# Patient Record
Sex: Male | Born: 1960 | Race: Black or African American | Hispanic: No | Marital: Married | Smoking: Never smoker
Health system: Southern US, Community
[De-identification: ages and names within clinical notes are randomized; demographics above are authoritative.]

## PROBLEM LIST (undated history)

## (undated) DIAGNOSIS — I10 Essential (primary) hypertension: Secondary | ICD-10-CM

## (undated) HISTORY — PX: NO PAST SURGERIES: SHX2092

---

## 2015-07-20 ENCOUNTER — Encounter: Payer: Self-pay | Admitting: Emergency Medicine

## 2015-07-20 ENCOUNTER — Ambulatory Visit (INDEPENDENT_AMBULATORY_CARE_PROVIDER_SITE_OTHER)
Admission: EM | Admit: 2015-07-20 | Discharge: 2015-07-20 | Disposition: A | Discharge status: Other Acute Inpatient Hospital | Payer: 59 | Source: Home / Self Care | Attending: Family Medicine | Admitting: Family Medicine

## 2015-07-20 ENCOUNTER — Observation Stay
Admission: EM | Admit: 2015-07-20 | Discharge: 2015-07-21 | Disposition: A | Discharge status: Home / Self Care | Payer: 59 | Attending: Internal Medicine | Admitting: Internal Medicine

## 2015-07-20 ENCOUNTER — Emergency Department: Payer: 59

## 2015-07-20 DIAGNOSIS — I10 Essential (primary) hypertension: Secondary | ICD-10-CM | POA: Diagnosis not present

## 2015-07-20 DIAGNOSIS — I340 Nonrheumatic mitral (valve) insufficiency: Secondary | ICD-10-CM | POA: Diagnosis not present

## 2015-07-20 DIAGNOSIS — I200 Unstable angina: Secondary | ICD-10-CM | POA: Diagnosis present

## 2015-07-20 DIAGNOSIS — R0789 Other chest pain: Principal | ICD-10-CM | POA: Diagnosis present

## 2015-07-20 DIAGNOSIS — I208 Other forms of angina pectoris: Secondary | ICD-10-CM

## 2015-07-20 DIAGNOSIS — R0602 Shortness of breath: Secondary | ICD-10-CM | POA: Insufficient documentation

## 2015-07-20 DIAGNOSIS — R079 Chest pain, unspecified: Secondary | ICD-10-CM | POA: Diagnosis not present

## 2015-07-20 DIAGNOSIS — I071 Rheumatic tricuspid insufficiency: Secondary | ICD-10-CM | POA: Insufficient documentation

## 2015-07-20 DIAGNOSIS — Z79899 Other long term (current) drug therapy: Secondary | ICD-10-CM | POA: Insufficient documentation

## 2015-07-20 DIAGNOSIS — H04123 Dry eye syndrome of bilateral lacrimal glands: Secondary | ICD-10-CM | POA: Insufficient documentation

## 2015-07-20 DIAGNOSIS — Z7982 Long term (current) use of aspirin: Secondary | ICD-10-CM | POA: Diagnosis not present

## 2015-07-20 DIAGNOSIS — R9431 Abnormal electrocardiogram [ECG] [EKG]: Secondary | ICD-10-CM | POA: Diagnosis present

## 2015-07-20 DIAGNOSIS — R001 Bradycardia, unspecified: Secondary | ICD-10-CM | POA: Insufficient documentation

## 2015-07-20 DIAGNOSIS — Z833 Family history of diabetes mellitus: Secondary | ICD-10-CM | POA: Diagnosis not present

## 2015-07-20 HISTORY — DX: Essential (primary) hypertension: I10

## 2015-07-20 LAB — BASIC METABOLIC PANEL
Anion gap: 7 (ref 5–15)
BUN: 13 mg/dL (ref 6–20)
CHLORIDE: 107 mmol/L (ref 101–111)
CO2: 27 mmol/L (ref 22–32)
CREATININE: 1.19 mg/dL (ref 0.61–1.24)
Calcium: 9 mg/dL (ref 8.9–10.3)
GFR calc Af Amer: >60 mL/min (ref >60)
GFR calc non Af Amer: >60 mL/min (ref >60)
GLUCOSE: 106 mg/dL — AB (ref 65–99)
POTASSIUM: 4.1 mmol/L (ref 3.5–5.1)
Sodium: 141 mmol/L (ref 135–145)

## 2015-07-20 LAB — CBC
HEMATOCRIT: 42.3 % (ref 40.0–52.0)
Hemoglobin: 13.8 g/dL (ref 13.0–18.0)
MCH: 29.3 pg (ref 26.0–34.0)
MCHC: 32.6 g/dL (ref 32.0–36.0)
MCV: 89.8 fL (ref 80.0–100.0)
PLATELETS: 207 10*3/uL (ref 150–440)
RBC: 4.7 MIL/uL (ref 4.40–5.90)
RDW: 13.5 % (ref 11.5–14.5)
WBC: 6.9 10*3/uL (ref 3.8–10.6)

## 2015-07-20 LAB — PROTIME-INR
INR: 0.97
Prothrombin Time: 13.1 seconds (ref 11.4–15.0)

## 2015-07-20 LAB — TROPONIN I: Troponin I: <0.03 ng/mL (ref <0.031)

## 2015-07-20 LAB — APTT: aPTT: 31 seconds (ref 24–36)

## 2015-07-20 MED ORDER — SODIUM CHLORIDE 0.9 % IJ SOLN
3.0000 mL | Freq: Two times a day (BID) | INTRAMUSCULAR | Status: DC
  Administered 2015-07-21 (×2): 3 mL via INTRAVENOUS

## 2015-07-20 MED ORDER — ONDANSETRON HCL 4 MG PO TABS: 4.0000 mg | ORAL_TABLET | Freq: Four times a day (QID) | ORAL | Status: DC | PRN

## 2015-07-20 MED ORDER — HEPARIN BOLUS VIA INFUSION
4000.0000 [IU] | Freq: Once | INTRAVENOUS | Status: AC
  Administered 2015-07-21: 4000 [IU] via INTRAVENOUS
  Filled 2015-07-20: qty 4000

## 2015-07-20 MED ORDER — NITROGLYCERIN 2 % TD OINT: 1.0000 [in_us] | TOPICAL_OINTMENT | Freq: Four times a day (QID) | TRANSDERMAL | Status: DC | PRN

## 2015-07-20 MED ORDER — ASPIRIN 81 MG PO CHEW
324.0000 mg | CHEWABLE_TABLET | Freq: Once | ORAL | Status: AC
  Administered 2015-07-20: 324 mg via ORAL

## 2015-07-20 MED ORDER — MORPHINE SULFATE (PF) 2 MG/ML IV SOLN: 2.0000 mg | INTRAVENOUS | Status: DC | PRN

## 2015-07-20 MED ORDER — NITROGLYCERIN 2 % TD OINT
1.0000 [in_us] | TOPICAL_OINTMENT | Freq: Once | TRANSDERMAL | Status: AC
  Administered 2015-07-20: 1 [in_us] via TOPICAL
  Filled 2015-07-20: qty 1

## 2015-07-20 MED ORDER — ONDANSETRON HCL 4 MG/2ML IJ SOLN: 4.0000 mg | Freq: Four times a day (QID) | INTRAMUSCULAR | Status: DC | PRN

## 2015-07-20 MED ORDER — ACETAMINOPHEN 650 MG RE SUPP: 650.0000 mg | Freq: Four times a day (QID) | RECTAL | Status: DC | PRN

## 2015-07-20 MED ORDER — HEPARIN (PORCINE) IN NACL 100-0.45 UNIT/ML-% IJ SOLN
850.0000 [IU]/h | INTRAMUSCULAR | Status: DC
  Administered 2015-07-21: 850 [IU]/h via INTRAVENOUS
  Filled 2015-07-20: qty 250

## 2015-07-20 MED ORDER — ACETAMINOPHEN 325 MG PO TABS
650.0000 mg | ORAL_TABLET | Freq: Four times a day (QID) | ORAL | Status: DC | PRN
  Administered 2015-07-21 (×2): 650 mg via ORAL
  Filled 2015-07-20 (×2): qty 2

## 2015-07-20 MED ORDER — NITROGLYCERIN 2 % TD OINT
1.0000 [in_us] | TOPICAL_OINTMENT | Freq: Once | TRANSDERMAL | Status: AC
  Administered 2015-07-20: 1 [in_us] via TOPICAL

## 2015-07-20 NOTE — ED Notes (Signed)
Pt bib EMS w/ c/o CP.  Pt sent from Urgent care in [place].  Pt seen yesterday at [facility] for CP and was DC.  Pt sts he had CP while at work today, went to urgent care and was sent here for chgs in EKG.  Pt denies SOB, dizziness and sts he had had one bought of nausea at 1630.  Pt given 1" nitro paste via EMS, pt sts that it relieved CP.  NAD.

## 2015-07-20 NOTE — ED Provider Notes (Signed)
[facility] Emergency Department Provider Note  ____________________________________________  Time seen: Approximately 9:35 PM  I have reviewed the triage vital signs and the nursing notes.   HISTORY  Chief Complaint Chest Pain    HPI Paul Thompson is a 54 y.o. male with past medical history of hypertension who presents by EMS from the [facility] urgent care for further evaluation of his chest pain.  He started having chest pain yesterday which is new for him.  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  He went to the emergency department at [facility] and was evaluated and discharged with negative troponins and a normal EKG.  Today his chest pain came back again at rest and he went to the urgent care for further evaluation.  They were able to obtain a copy of the EKG from yesterday and today he has lateral T-wave inversions.  Given the EKG changes and the persistent and worsening symptoms, he was transported by EMS to the emergency department.  He describes the pain is acute in onset, occurs at rest, and feels like a moderate heaviness and pressure in the center of his chest.  It was relieved with nitroglycerin paste on the way to the emergency department.  He denies associated shortness of breath, nausea/vomiting, diaphoresis, headache, abdominal pain.  The pain does not radiate.  After Coming to the emergency department the nitroglycerin was removed.  He said the chest pain as starting come back but it feels sharp and mild in the left side of his chest.   Past Medical History  Diagnosis Date  . Hypertension     There are no active problems to display for this patient.   History reviewed. No pertinent past surgical history.  No current outpatient prescriptions on file.  Allergies Review of patient's allergies indicates no known allergies.  History reviewed. No pertinent family history. patient denies any first-degree relatives  with a history of CAD or ACS.  Social History Social History  Substance Use Topics  . Smoking status: Never Smoker   . Smokeless tobacco: None  . Alcohol Use: No    Review of Systems Constitutional: No fever/chills Eyes: No visual changes. ENT: No sore throat. Cardiovascular: Intermittent episodic chest pain at rest Respiratory: Denies shortness of breath. Gastrointestinal: No abdominal pain.  No nausea, no vomiting.  No diarrhea.  No constipation. Genitourinary: Negative for dysuria. Musculoskeletal: Negative for back pain. Skin: Negative for rash. Neurological: Negative for headaches, focal weakness or numbness.  10-point ROS otherwise negative.  ____________________________________________   PHYSICAL EXAM:  VITAL SIGNS: ED Triage Vitals  Enc Vitals Group     BP 07/20/15 2129 143/83 mmHg     Pulse Rate 07/20/15 2129 51     Resp 07/20/15 2129 12     Temp 07/20/15 2129 98.3 F (36.8 C)     Temp Source 07/20/15 2129 Oral     SpO2 07/20/15 2129 100 %     Weight 07/20/15 2129 160 lb (72.576 kg)     Height 07/20/15 2129  (1.702 m)     Head Cir --      Peak Flow --      Pain Score 07/20/15 2129 0     Pain Loc --      Pain Edu? --      Excl. in GC? --     Constitutional: Alert and oriented. Well appearing and in no acute distress. Eyes: Conjunctivae are normal. PERRL. EOMI.  Head: Atraumatic. Nose: No congestion/rhinnorhea. Mouth/Throat: Mucous membranes are moist.  Oropharynx non-erythematous. Neck: No stridor.   Cardiovascular: Normal rate, regular rhythm. Grossly normal heart sounds.  Good peripheral circulation. Respiratory: Normal respiratory effort.  No retractions. Lungs CTAB. Gastrointestinal: Soft and nontender. No distention. No abdominal bruits. No CVA tenderness. Musculoskeletal: No lower extremity tenderness nor edema.  No joint effusions. Neurologic:  Normal speech and language. No gross focal neurologic deficits are appreciated.  Skin:  Skin is  warm, dry and intact. No rash noted. Psychiatric: Mood and affect are normal. Speech and behavior are normal.  ____________________________________________   LABS (all labs ordered are listed, but only abnormal results are displayed)  Labs Reviewed  BASIC METABOLIC PANEL - Abnormal; Notable for the following:    Glucose, Bld 106 (*)    All other components within normal limits  CBC  TROPONIN I  APTT  PROTIME-INR   ____________________________________________  EKG  ED ECG REPORT I, Treylen Gibbs, the attending physician, personally viewed and interpreted this ECG.   Date: 07/20/2015  EKG Time: 21:25  Rate: 51  Rhythm: sinus bradycardia  Axis: Normal  Intervals:Occasional premature atrial complex but intervals are normal in appearance  ST&T Change: Inverted T waves in leads V5 and V6 as well as lead 3.  These T-wave inversions are new since his EKG yesterday.  ____________________________________________  RADIOLOGY   Dg Chest 2 View  07/20/2015  CLINICAL DATA:  Acute onset of generalized chest pain and nausea. Initial encounter. EXAM: CHEST  2 VIEW COMPARISON:  None. FINDINGS: The lungs are well-aerated and clear. There is no evidence of focal opacification, pleural effusion or pneumothorax. The heart is normal in size; the mediastinal contour is within normal limits. No acute osseous abnormalities are seen. IMPRESSION: No acute cardiopulmonary process seen. Electronically Signed   By: Roanna Raider M.D.   On: 07/20/2015 21:57    ____________________________________________   PROCEDURES  Procedure(s) performed: None  Critical Care performed: Yes, see critical care note(s)   CRITICAL CARE Performed by: Loleta Rose   Total critical care time: 30 minutes  Critical care time was exclusive of separately billable procedures and treating other patients.  Critical care was necessary to treat or prevent imminent or life-threatening deterioration.  Critical care  was time spent personally by me on the following activities: development of treatment plan with patient and/or surrogate as well as nursing, discussions with consultants, evaluation of patient's response to treatment, examination of patient, obtaining history from patient or surrogate, ordering and performing treatments and interventions, ordering and review of laboratory studies, ordering and review of radiographic studies, pulse oximetry and re-evaluation of patient's condition.  ____________________________________________   INITIAL IMPRESSION / ASSESSMENT AND PLAN / ED COURSE  Pertinent labs & imaging results that were available during my care of the patient were reviewed by me and considered in my medical decision making (see chart for details).  Though the patient only has hypertension as a risk factor, his symptoms are very concerning; he has had intermittent chest pressure at rest and now has EKG changes compared to yesterday.  Even though his troponin is negative, I believe his symptoms are most likely caused by acute coronary syndrome/unstable angina.  He received a full dose aspirin prior to transport to the emergency department.  His pain is starting to come back after having a Nitropaste removed, so I will give him another 1 inch of education paced on his anterior chest wall.  Additionally I am starting him on heparin per  pharmacy protocol.  I have discussed with the patient my concerns and he is agreeable to hospital admission for further evaluation of his chest pain and probable unstable angina.  Coagulation studies are currently pending.  ____________________________________________  FINAL CLINICAL IMPRESSION(S) / ED DIAGNOSES  Final diagnoses:  Unstable angina (HCC)      NEW MEDICATIONS STARTED DURING THIS VISIT:  New Prescriptions   No medications on file     Loleta Roseory Melvia Matousek, MD 07/20/15 2302

## 2015-07-20 NOTE — ED Notes (Signed)
4 calls place to [facility] [place] and still no fax of EKG received. Dr. Judd Gaudieronty informed

## 2015-07-20 NOTE — ED Notes (Signed)
Sandwich tray given 

## 2015-07-20 NOTE — ED Provider Notes (Signed)
CSN: [id number]     Arrival date & time 07/20/15  1653 History   First MD Initiated Contact with Patient 07/20/15 1747     Chief Complaint  Patient presents with  . Chest Pain   (Consider location/radiation/quality/duration/timing/severity/associated sxs/prior Treatment) HPI Comments: 54 yo male with a h/o uncontrolled hypertension, presents with a complaint of substernal chest pressure, non-radiating that started earlier this evening. Patient states was sitting at work when felt sudden onset of pain and "heart racing". Denies recent infection, cough, injuries, fevers, chills, shortness of breath. Patient [facility] [place] with similar symptoms and work up negative (including cardiac enzymes); however they recommended patient follow up for an outpatient stress test.   The history is provided by the patient.    Past Medical History  Diagnosis Date  . Hypertension    History reviewed. No pertinent past surgical history. History reviewed. No pertinent family history. Social History  Substance Use Topics  . Smoking status: Never Smoker   . Smokeless tobacco: None  . Alcohol Use: No    Review of Systems  Allergies  Review of patient's allergies indicates no known allergies.  Home Medications   Prior to Admission medications   Medication Sig Start Date End Date Taking? Authorizing Provider  lisinopril (PRINIVIL,ZESTRIL) 10 MG tablet Take 10 mg by mouth daily.   Yes Historical Provider, MD   Meds Ordered and Administered this Visit   Medications  aspirin chewable tablet 324 mg (324 mg Oral Given 07/20/15 1730)  nitroGLYCERIN (NITROGLYN) 2 % ointment 1 inch (1 inch Topical Given 07/20/15 1733)    BP 140/87 mmHg  Pulse 57  Temp(Src) 98.2 F (36.8 C) (Oral)  Resp 16  Ht 5\' 7"  (1.702 m)  Wt 160 lb (72.576 kg)  BMI 25.05 kg/m2  SpO2 98% No data found.   Physical Exam  Constitutional: He is oriented to person, place, and time. He appears  well-developed and well-nourished. No distress.  HENT:  Head: Normocephalic and atraumatic.  Cardiovascular: Normal rate, regular rhythm, normal heart sounds and intact distal pulses.   No murmur heard. Pulmonary/Chest: Effort normal and breath sounds normal. No respiratory distress. He has no wheezes. He has no rales. He exhibits no tenderness.  Abdominal: Soft. Bowel sounds are normal. He exhibits no distension and no mass. There is no tenderness. There is no rebound and no guarding.  Musculoskeletal: He exhibits no edema.  Neurological: He is alert and oriented to person, place, and time.  Skin: No rash noted. He is not diaphoretic.  Nursing note and vitals reviewed.   ED Course  Procedures (including critical care time)  Labs Review Labs Reviewed - No data to display  Imaging Review No results found.   Visual Acuity Review  Right Eye Distance:   Left Eye Distance:   Bilateral Distance:    Right Eye Near:   Left Eye Near:    Bilateral Near:       EKG: normal sinus rhythm, occasional PVC noted, unifocal, T-wave inversion; no EKG tracing available for comparison; report from yesterday's EKG available and no mention of T wave inversion or PVCs (contacted [facility] ED and medical records, signed record release, multiple phone calls;  however they were unable to send us a copy of the EKG tracing)   MDM   1. Chest pain, unspecified chest pain type    Discharge Medication List as of 07/20/2015  8:54 PM     1. Patient given 1" nitropaste and 4  baby ( ) aspirin with resolution of his chest pressure symptoms 2. EKG results and possible etiologies reviewed with patient; due to recurrent symptoms and new EKG findings, recommend patient go to hospital ED by EMS for further evaluation and management.      Payton Mccallum, MD 07/20/15 2105

## 2015-07-20 NOTE — Progress Notes (Signed)
ANTICOAGULATION CONSULT NOTE - Initial Consult  Pharmacy Consult for heparin drip Indication: unstable angina  No Known Allergies  Patient Measurements: Height: 5\' 7"  (170.2 cm) Weight: 160 lb (72.576 kg) IBW/kg (Calculated) : 66.1 Heparin Dosing Weight: 72.6kg  Vital Signs: Temp: 98.3 F (36.8 C) (12/26 2129) Temp Source: Oral (12/26 2129) BP: 126/86 mmHg (12/26 2305) Pulse Rate: 56 (12/26 2305)  Labs:  Recent Labs  07/20/15 2133 07/20/15 2255  HGB 13.8  --   HCT 42.3  --   PLT 207  --   APTT  --  31  LABPROT  --  13.1  INR  --  0.97  CREATININE 1.19  --   TROPONINI <0.03  --     Estimated Creatinine Clearance: 65.6 mL/min (by C-G formula based on Cr of 1.19).   Medical History: Past Medical History  Diagnosis Date  . Hypertension     Medications:    Assessment: Hgb 13.8  plt 207 INR 0.97  aPTT 31  Goal of Therapy:  Heparin level 0.3-0.7 units/ml Monitor platelets by anticoagulation protocol: Yes   Plan:  4000 unit bolus and initial rate of 850 units/hr. Check first anti-Xa 6 hours after start of infusion.  Bora Bost S 07/20/2015,11:55 PM

## 2015-07-20 NOTE — ED Notes (Signed)
Patient transported to X-ray 

## 2015-07-20 NOTE — H&P (Signed)
[facility] [place] at [facility]   PATIENT NAME: Paul Thompson    MR#:  [id number]  DATE OF BIRTH:  August 05, 1960  DATE OF ADMISSION:  07/20/2015  PRIMARY CARE PHYSICIAN: No primary care provider on file.   REQUESTING/REFERRING PHYSICIAN: York Cerise, MD  CHIEF COMPLAINT:   Chief Complaint  Patient presents with  . Chest Pain    HISTORY OF PRESENT ILLNESS:  Paul Thompson  is a 54 y.o. male who presents with unstable angina. Patient has no prior history of heart disease. Only has a history of hypertension. States that yesterday morning he woke up with "not feeling right". He went to [facility] ED for evaluation and was told he had nothing significantly wrong with him. Later that afternoon he began to have what he described as some "chest discomfort", and this morning when he woke up it was more persistent and more intense. He went to urgent care for evaluation and there was found to have new EKG changes. Urgent care actually obtained his EKG from [facility] ED from the day before, and he had some new T-wave changes. He was sent to our ED for further evaluation. Here his cardiac enzymes have been negative. However, he describes his chest pain as exertional, associated at its worse with some shortness of breath. He denies diaphoresis or radiation, nausea or vomiting, abdominal pain, vision changes, headaches. He was given Nitropaste, and his discomfort improved significantly. Hospitalists were called for admission.  PAST MEDICAL HISTORY:   Past Medical History  Diagnosis Date  . Hypertension     PAST SURGICAL HISTORY:   Past Surgical History  Procedure Laterality Date  . No past surgeries      SOCIAL HISTORY:   Social History  Substance Use Topics  . Smoking status: Never Smoker   . Smokeless tobacco: Not on file  . Alcohol Use: No    FAMILY HISTORY:   Family History  Problem Relation Age of Onset  . Diabetes      DRUG ALLERGIES:  No Known  Allergies  MEDICATIONS AT HOME:   Prior to Admission medications   Not on File    REVIEW OF SYSTEMS:  Review of Systems  Constitutional: Negative for fever, chills, weight loss and malaise/fatigue.  HENT: Negative for ear pain, hearing loss and tinnitus.   Eyes: Negative for blurred vision, double vision, pain and redness.  Respiratory: Positive for shortness of breath. Negative for cough and hemoptysis.   Cardiovascular: Positive for chest pain. Negative for palpitations, orthopnea and leg swelling.  Gastrointestinal: Negative for nausea, vomiting, abdominal pain, diarrhea and constipation.  Genitourinary: Negative for dysuria, frequency and hematuria.  Musculoskeletal: Negative for back pain, joint pain and neck pain.  Skin:       No acne, rash, or lesions  Neurological: Negative for dizziness, tremors, focal weakness and weakness.  Endo/Heme/Allergies: Negative for polydipsia. Does not bruise/bleed easily.  Psychiatric/Behavioral: Negative for depression. The patient is not nervous/anxious and does not have insomnia.      VITAL SIGNS:   Filed Vitals:   07/20/15 2253 07/20/15 2255 07/20/15 2300 07/20/15 2305  BP: 130/81 125/81  126/86  Pulse: 57 61 49 56  Temp:      TempSrc:      Resp: Height:      Weight:      SpO2: 100% 99% 99% 100%   Wt Readings from Last 3 Encounters:  07/20/15 72.576 kg (160 lb)  07/20/15 72.576 kg (160  lb)    PHYSICAL EXAMINATION:  Physical Exam  Vitals reviewed. Constitutional: He is oriented to person, place, and time. He appears well-developed and well-nourished. No distress.  HENT:  Head: Normocephalic and atraumatic.  Mouth/Throat: Oropharynx is clear and moist.  Eyes: Conjunctivae and EOM are normal. Pupils are equal, round, and reactive to light. No scleral icterus.  Neck: Normal range of motion. Neck supple. No JVD present. No thyromegaly present.  Cardiovascular: Normal rate, regular rhythm and intact distal pulses.   Exam reveals no gallop and no friction rub.   No murmur heard. Respiratory: Effort normal and breath sounds normal. No respiratory distress. He has no wheezes. He has no rales.  GI: Soft. Bowel sounds are normal. He exhibits no distension. There is no tenderness.  Musculoskeletal: Normal range of motion. He exhibits no edema.  No arthritis, no gout  Lymphadenopathy:    He has no cervical adenopathy.  Neurological: He is alert and oriented to person, place, and time. No cranial nerve deficit.  No dysarthria, no aphasia  Skin: Skin is warm and dry. No rash noted. No erythema.  Psychiatric: He has a normal mood and affect. His behavior is normal. Judgment and thought content normal.    LABORATORY PANEL:   CBC  Recent Labs Lab 07/20/15 2133  WBC 6.9  HGB 13.8  HCT 42.3  PLT 207   ------------------------------------------------------------------------------------------------------------------  Chemistries   Recent Labs Lab 07/20/15 2133  NA 141  K 4.1  CL 107  CO2 27  GLUCOSE 106*  BUN 13  CREATININE 1.19  CALCIUM 9.0   ------------------------------------------------------------------------------------------------------------------  Cardiac Enzymes  Recent Labs Lab 07/20/15 2133  TROPONINI <0.03   ------------------------------------------------------------------------------------------------------------------  RADIOLOGY:  Dg Chest 2 View  07/20/2015  CLINICAL DATA:  Acute onset of generalized chest pain and nausea. Initial encounter. EXAM: CHEST  2 VIEW COMPARISON:  None. FINDINGS: The lungs are well-aerated and clear. There is no evidence of focal opacification, pleural effusion or pneumothorax. The heart is normal in size; the mediastinal contour is within normal limits. No acute osseous abnormalities are seen. IMPRESSION: No acute cardiopulmonary process seen. Electronically Signed   By: Roanna Raider M.D.   On: 07/20/2015 21:57    EKG:   Orders placed  or performed during the hospital encounter of 07/20/15  . ED EKG within 10 minutes  . ED EKG within 10 minutes    IMPRESSION AND PLAN:  Principal Problem:   Unstable angina (HCC) - given that a number of his symptoms qualifies typical chest pain symptoms, his chest pain improved with nitroglycerin, and he has new EKG changes patient was started on a heparin drip in the ED despite his first cardiac enzymes being negative. We'll admit him tonight and trend his cardiac enzymes, and order cardiology consult for the morning. Active Problems:   Nonspecific ST-T wave electrocardiographic changes - with negative troponin initially, though his EKG changes are new from yesterday to today based on comparison of his EKG from [facility] ED and his EKG from urgent care and here in our ED today. Heparin drip and cardiology consult as above   HTN (hypertenson) - initially elevated here, though improved significantly after Nitropaste. We'll monitor closely and use when necessary antihypertensives to keep his blood pressure less than 160/100.  All the records are reviewed and case discussed with ED provider. Management plans discussed with the patient and/or family.  DVT PROPHYLAXIS: Systemic anticoagulation  GI PROPHYLAXIS: None  ADMISSION STATUS: Observation  CODE STATUS: Full  TOTAL TIME  TAKING CARE OF THIS PATIENT: 40 minutes.    Eda Magnussen, Richrd FIELDING 07/20/2015, 11:22 PM  [facility]  Office  [phone]  CC: Primary care physician; No primary care provider on file.

## 2015-07-20 NOTE — ED Notes (Signed)
Patient c/o chest pain that started about ago while at work.  Patient reports nausea.

## 2015-07-20 NOTE — ED Notes (Signed)
Patient states that he has not been taking his blood pressure medicine.

## 2015-07-20 NOTE — ED Notes (Signed)
Patient states is completely pain free. Patient signed release of records for [facility] [place]-faxed to them 5 minutes ago for them to fax copy of EKG done there yesterday, for comparison

## 2015-07-21 ENCOUNTER — Emergency Department (HOSPITAL_COMMUNITY): Payer: 59

## 2015-07-21 ENCOUNTER — Encounter: Payer: Self-pay | Admitting: Radiology

## 2015-07-21 ENCOUNTER — Observation Stay
Admit: 2015-07-21 | Discharge: 2015-07-21 | Disposition: A | Discharge status: Home / Self Care | Payer: 59 | Attending: Internal Medicine | Admitting: Internal Medicine

## 2015-07-21 ENCOUNTER — Observation Stay: Payer: 59

## 2015-07-21 ENCOUNTER — Encounter (HOSPITAL_COMMUNITY): Payer: Self-pay | Admitting: Emergency Medicine

## 2015-07-21 ENCOUNTER — Emergency Department (HOSPITAL_COMMUNITY)
Admission: EM | Admit: 2015-07-21 | Discharge: 2015-07-22 | Disposition: A | Discharge status: Home / Self Care | Payer: 59 | Attending: Emergency Medicine | Admitting: Emergency Medicine

## 2015-07-21 DIAGNOSIS — R0789 Other chest pain: Secondary | ICD-10-CM | POA: Insufficient documentation

## 2015-07-21 DIAGNOSIS — R079 Chest pain, unspecified: Secondary | ICD-10-CM | POA: Diagnosis present

## 2015-07-21 DIAGNOSIS — R531 Weakness: Secondary | ICD-10-CM | POA: Diagnosis not present

## 2015-07-21 DIAGNOSIS — I10 Essential (primary) hypertension: Secondary | ICD-10-CM | POA: Diagnosis not present

## 2015-07-21 LAB — HEPATIC FUNCTION PANEL
ALBUMIN: 4.1 g/dL (ref 3.5–5.0)
ALT: 21 U/L (ref 17–63)
AST: 22 U/L (ref 15–41)
Alkaline Phosphatase: 49 U/L (ref 38–126)
BILIRUBIN TOTAL: 1.6 mg/dL — AB (ref 0.3–1.2)
Bilirubin, Direct: 0.2 mg/dL (ref 0.1–0.5)
Indirect Bilirubin: 1.4 mg/dL — ABNORMAL HIGH (ref 0.3–0.9)
Total Protein: 6.6 g/dL (ref 6.5–8.1)

## 2015-07-21 LAB — CBC
HEMATOCRIT: 41.7 % (ref 40.0–52.0)
HEMATOCRIT: 43.1 % (ref 39.0–52.0)
HEMOGLOBIN: 13.6 g/dL (ref 13.0–18.0)
HEMOGLOBIN: 14.5 g/dL (ref 13.0–17.0)
MCH: 29 pg (ref 26.0–34.0)
MCH: 30.1 pg (ref 26.0–34.0)
MCHC: 32.5 g/dL (ref 32.0–36.0)
MCHC: 33.6 g/dL (ref 30.0–36.0)
MCV: 89 fL (ref 80.0–100.0)
MCV: 89.4 fL (ref 78.0–100.0)
Platelets: 214 10*3/uL (ref 150–440)
Platelets: 224 10*3/uL (ref 150–400)
RBC: 4.69 MIL/uL (ref 4.40–5.90)
RBC: 4.82 MIL/uL (ref 4.22–5.81)
RDW: 13.6 % (ref 11.5–14.5)
RDW: 13.3 % (ref 11.5–15.5)
WBC: 6 10*3/uL (ref 3.8–10.6)
WBC: 5.6 10*3/uL (ref 4.0–10.5)

## 2015-07-21 LAB — BASIC METABOLIC PANEL
ANION GAP: 5 (ref 5–15)
ANION GAP: 10 (ref 5–15)
BUN: 14 mg/dL (ref 6–20)
BUN: 15 mg/dL (ref 6–20)
CALCIUM: 9 mg/dL (ref 8.9–10.3)
CALCIUM: 9.4 mg/dL (ref 8.9–10.3)
CHLORIDE: 109 mmol/L (ref 101–111)
CO2: 29 mmol/L (ref 22–32)
CO2: 25 mmol/L (ref 22–32)
Chloride: 106 mmol/L (ref 101–111)
Creatinine, Ser: 1.29 mg/dL — ABNORMAL HIGH (ref 0.61–1.24)
Creatinine, Ser: 1.32 mg/dL — ABNORMAL HIGH (ref 0.61–1.24)
GFR calc non Af Amer: >60 mL/min (ref >60)
GFR calc non Af Amer: 59 mL/min — ABNORMAL LOW (ref >60)
GLUCOSE: 105 mg/dL — AB (ref 65–99)
Glucose, Bld: 83 mg/dL (ref 65–99)
POTASSIUM: 3.9 mmol/L (ref 3.5–5.1)
POTASSIUM: 3.6 mmol/L (ref 3.5–5.1)
Sodium: 143 mmol/L (ref 135–145)
Sodium: 141 mmol/L (ref 135–145)

## 2015-07-21 LAB — D-DIMER, QUANTITATIVE (NOT AT ARMC): D DIMER QUANT: 0.3 ug{FEU}/mL (ref 0.00–0.50)

## 2015-07-21 LAB — LIPASE, BLOOD: Lipase: 38 U/L (ref 11–51)

## 2015-07-21 LAB — TROPONIN I
Troponin I: 0.03 ng/mL (ref <0.031)
Troponin I: <0.03 ng/mL (ref <0.031)

## 2015-07-21 LAB — I-STAT TROPONIN, ED: TROPONIN I, POC: 0 ng/mL (ref 0.00–0.08)

## 2015-07-21 LAB — HEPARIN LEVEL (UNFRACTIONATED): HEPARIN UNFRACTIONATED: 0.62 [IU]/mL (ref 0.30–0.70)

## 2015-07-21 MED ORDER — POLYVINYL ALCOHOL 1.4 % OP SOLN
1.0000 [drp] | OPHTHALMIC | Status: DC | PRN
  Administered 2015-07-21: 1 [drp] via OPHTHALMIC
  Filled 2015-07-21: qty 15

## 2015-07-21 MED ORDER — TECHNETIUM TC 99M SESTAMIBI - CARDIOLITE
29.0430 | Freq: Once | INTRAVENOUS | Status: AC | PRN
  Administered 2015-07-21: 29.043 via INTRAVENOUS

## 2015-07-21 MED ORDER — TECHNETIUM TC 99M SESTAMIBI GENERIC - CARDIOLITE
13.0000 | Freq: Once | INTRAVENOUS | Status: AC | PRN
  Administered 2015-07-21: 13.53 via INTRAVENOUS

## 2015-07-21 MED ORDER — NAPHAZOLINE HCL 0.1 % OP SOLN
1.0000 [drp] | Freq: Four times a day (QID) | OPHTHALMIC | Status: DC | PRN
  Filled 2015-07-21: qty 15

## 2015-07-21 MED ORDER — NAPHAZOLINE-PHENIRAMINE 0.025-0.3 % OP SOLN
1.0000 [drp] | Freq: Four times a day (QID) | OPHTHALMIC | Status: DC | PRN
  Filled 2015-07-21: qty 5

## 2015-07-21 NOTE — Progress Notes (Signed)
*  PRELIMINARY RESULTS* Echocardiogram 2D Echocardiogram has been performed.  Georgann HousekeeperJerry R Hege 07/21/2015, 12:17 PM

## 2015-07-21 NOTE — ED Notes (Addendum)
Patient c/o weakness in left arm, nausea, and left sided CP. Discharged today from [facility] for unstable angina. Denies fever,chills, diarrhea, SOB, visual changes. Quick neuro assessment in triage, intact. No loss of sensation, no drift, no facial droop, A&O x4.

## 2015-07-21 NOTE — Consult Note (Signed)
[facility]  Patient ID: Paul MallickDavid L Thompson, MRN: [id number], DOB/AGE: 55/07/1960 55 y.o. Admit date: 07/20/2015   Date of Consult: 07/21/2015 Primary Physician: No primary care provider on file. Primary Cardiologist: None  Chief Complaint:  Chief Complaint  Patient presents with  . Chest Pain   Reason for Consult: acute chest pain with known hypertension  HPI: 54 y.o. male with the known essential hypertension having episodes of acute chest pain substernal in nature radiating into the back and left arm intermittent in nature with an abnormal EKG but no evidence of troponin level elevation. The patient has had full resolution of chest discomfort and there was no associated shortness of breath. Currently the patient has undergone a treadmill EKG and stress test with an echocardiogram showing normal LV systolic function with ejection fraction of 55% as well as normal myocardial perfusion without evidence of myocardial ischemia.  Past Medical History  Diagnosis Date  . Hypertension       Surgical History:  Past Surgical History  Procedure Laterality Date  . No past surgeries       Home Meds: Prior to Admission medications   Not on File    Inpatient Medications:  . sodium chloride  3 mL Intravenous Q12H      Allergies: No Known Allergies  Social History   Social History  . Marital Status: Single    Spouse Name: N/A  . Number of Children: N/A  . Years of Education: N/A   Occupational History  . Not on file.   Social History Main Topics  . Smoking status: Never Smoker   . Smokeless tobacco: Not on file  . Alcohol Use: No  . Drug Use: No  . Sexual Activity: Not on file   Other Topics Concern  . Not on file   Social History Narrative     Family History  Problem Relation Age of Onset  . Diabetes       Review of Systems Positive for chest pain Negative for: General:  chills, fever, night sweats or weight changes.   Cardiovascular: PND orthopnea syncope dizziness  Dermatological skin lesions rashes Respiratory: Cough congestion Urologic: Frequent urination urination at night and hematuria Abdominal: negative for nausea, vomiting, diarrhea, bright red blood per rectum, melena, or hematemesis Neurologic: negative for visual changes, and/or hearing changes  All other systems reviewed and are otherwise negative except as noted above.  Labs:  Recent Labs  07/20/15 2133 07/21/15 0402 07/21/15 0957  TROPONINI <0.03 0.03 <0.03   Lab Results  Component Value Date   WBC 6.0 07/21/2015   HGB 13.6 07/21/2015   HCT 41.7 07/21/2015   MCV 89.0 07/21/2015   PLT 214 07/21/2015    Recent Labs Lab 07/21/15 0404  NA 143  K 3.9  CL 109  CO2 29  BUN 14  CREATININE 1.29*  CALCIUM 9.0  GLUCOSE 105*   No results found for: CHOL, HDL, LDLCALC, TRIG No results found for: DDIMER  Radiology/Studies:  Dg Chest 2 View  07/20/2015  CLINICAL DATA:  Acute onset of generalized chest pain and nausea. Initial encounter. EXAM: CHEST  2 VIEW COMPARISON:  None. FINDINGS: The lungs are well-aerated and clear. There is no evidence of focal opacification, pleural effusion or pneumothorax. The heart is normal in size; the mediastinal contour is within normal limits. No acute osseous abnormalities are seen. IMPRESSION: No acute cardiopulmonary process seen. Electronically Signed   By: Roanna RaiderJeffery  Chang M.D.   On: 07/20/2015 21:57  EKG: Normal sinus rhythm. Normal EKG  Weights: [facility] Weights   07/20/15 2129 07/21/15 0003  Weight: 160 lb (72.576 kg) 157 lb 3.2 oz (71.305 kg)     Physical Exam: Blood pressure 112/69, pulse 57, temperature 98.2 F (36.8 C), temperature source Oral, resp. rate 24, height  (1.702 m), weight 157 lb 3.2 oz (71.305 kg), SpO2 97 %. Body mass index is 24.62 kg/(m^2). General: Well developed, well nourished, in no acute distress. Head eyes ears nose throat: Normocephalic, atraumatic,  sclera non-icteric, no xanthomas, nares are without discharge. No apparent thyromegaly and/or mass  Lungs: Normal respiratory effort.  no wheezes, no rales, no rhonchi.  Heart: RRR with normal S1 S2. no murmur gallop, no rub, PMI is normal size and placement, carotid upstroke normal without bruit, jugular venous pressure is normal Abdomen: Soft, non-tender, non-distended with normoactive bowel sounds. No hepatomegaly. No rebound/guarding. No obvious abdominal masses. Abdominal aorta is normal size without bruit Extremities: No edema. no cyanosis, no clubbing, no ulcers  Peripheral : 2+ bilateral upper extremity pulses, 2+ bilateral femoral pulses, 2+ bilateral dorsal pedal pulse Neuro: Alert and oriented. No facial asymmetry. No focal deficit. Moves all extremities spontaneously. Musculoskeletal: Normal muscle tone without kyphosis Psych:  Responds to questions appropriately with a normal affect.    Assessment: 54 year old male with essential hypertension with abnormal EKG and chest pain but no current evidence of myocardial ischemia by stress test or myocardial infarction  Plan: 1. Okay for discharge home from cardiac standpoint without evidence of ischemia or infarct 2. Risk factor modification and hypertension control as necessary 3. Follow-up for further evaluation and treatment options of chest pain if there is any return over the next 2 weeks  Signed, Lamar Blinks M.D. [facility] Cardiology 07/21/2015, 1:17 PM

## 2015-07-21 NOTE — ED Notes (Signed)
Ambulating to restroom.

## 2015-07-21 NOTE — ED Provider Notes (Signed)
CSN: [id number]     Arrival date & time 07/21/15  2106 History   First MD Initiated Contact with Patient 07/21/15 2144     Chief Complaint  Patient presents with  . Extremity Weakness  . Chest Pain    HPI   [name] is an 54 y.o. male with history of HTN who presents to the ED for evaluation of chest pain. Of note, pt was discharged today from [place] after an overnight chest pain rule out admission. All of his workup including delta troponins, exercise stres, stress echo, and nuclear were all negative. Pt states that at time of discharge he was asymptomatic. He states that he got home and was eating dinner around 6PM when he started experiencing a soft, dull chest pressure on the left side of his chest. He reports associated tingling down his left arm. He states that the tingling has since resolved but he continues to endorse a light pressure on his chest as if "someone was barely touching you." Pt has notably been seen in multiple urgent cares and EDs over the past several days for this complaint. He does have an abnormal EKG but his workup has continued to be unrevealing. Pt denies diaphoresis, weakness, lightheadedness. Denies abdominal pain, n/v/d. He states he thinks his symptoms might be due to exposure to chemicals at home as his house was recently fumigated.  Past Medical History  Diagnosis Date  . Hypertension    Past Surgical History  Procedure Laterality Date  . No past surgeries     Family History  Problem Relation Age of Onset  . Diabetes     Social History  Substance Use Topics  . Smoking status: Never Smoker   . Smokeless tobacco: None  . Alcohol Use: No    Review of Systems  All other systems reviewed and are negative.     Allergies  Review of patient's allergies indicates no known allergies.  Home Medications   Prior to Admission medications   Medication Sig Start Date End Date Taking? Authorizing Provider  acetaminophen (TYLENOL) 500 MG tablet Take  1,000 mg by mouth every 6 (six) hours as needed for mild pain.   Yes Historical Provider, MD   BP 155/100 mmHg  Pulse 73  Temp(Src) 98.1 F (36.7 C) (Oral)  Resp 16  SpO2 99% Physical Exam  Constitutional: He is oriented to person, place, and time. No distress.  HENT:  Right Ear: External ear normal.  Left Ear: External ear normal.  Nose: Nose normal.  Mouth/Throat: Oropharynx is clear and moist. No oropharyngeal exudate.  Eyes: Conjunctivae and EOM are normal. Pupils are equal, round, and reactive to light.  Neck: Normal range of motion. Neck supple.  Cardiovascular: Normal rate, regular rhythm, normal heart sounds and intact distal pulses.   Pulmonary/Chest: Effort normal and breath sounds normal. No respiratory distress. He has no wheezes. He exhibits no tenderness.  Abdominal: Soft. Bowel sounds are normal. He exhibits no distension. There is no tenderness.  Musculoskeletal: He exhibits no edema.  Neurological: He is alert and oriented to person, place, and time. No cranial nerve deficit.  Skin: Skin is warm. He is not diaphoretic.  Psychiatric: He has a normal mood and affect.  Nursing note and vitals reviewed.  Filed Vitals:   07/21/15 2116 07/21/15 2153 07/22/15 0003  BP: 188/100 155/100 147/92  Pulse: 79 73 66  Temp: 98.1 F (36.7 C)    TempSrc: Oral    Resp: 20 16 16   SpO2: 99%  99% 99%     ED Course  Procedures (including critical care time) Labs Review Labs Reviewed  BASIC METABOLIC PANEL - Abnormal; Notable for the following:    Creatinine, Ser 1.32 (*)    GFR calc non Af Amer 59 (*)    All other components within normal limits  HEPATIC FUNCTION PANEL - Abnormal; Notable for the following:    Total Bilirubin 1.6 (*)    Indirect Bilirubin 1.4 (*)    All other components within normal limits  CBC  LIPASE, BLOOD  D-DIMER, QUANTITATIVE (NOT AT [facility])  I-STAT TROPOININ, ED  Rosezena Sensor, ED    Imaging Review Dg Chest 2 View  07/21/2015  CLINICAL  DATA:  54 year old male with left-sided chest pain EXAM: CHEST  2 VIEW COMPARISON:  Radiograph dated 07/20/2015 FINDINGS: The heart size and mediastinal contours are within normal limits. Both lungs are clear. The visualized skeletal structures are unremarkable. IMPRESSION: No active cardiopulmonary disease. Electronically Signed   By: Elgie Collard M.D.   On: 07/21/2015 21:52   Dg Chest 2 View  07/20/2015  CLINICAL DATA:  Acute onset of generalized chest pain and nausea. Initial encounter. EXAM: CHEST  2 VIEW COMPARISON:  None. FINDINGS: The lungs are well-aerated and clear. There is no evidence of focal opacification, pleural effusion or pneumothorax. The heart is normal in size; the mediastinal contour is within normal limits. No acute osseous abnormalities are seen. IMPRESSION: No acute cardiopulmonary process seen. Electronically Signed   By: Roanna Raider M.D.   On: 07/20/2015 21:57   I have personally reviewed and evaluated these images and lab results as part of my medical decision-making.   EKG Interpretation   Date/Time:  Tuesday July 21 2015 21:20:10 EST Ventricular Rate:  81 PR Interval:  163 QRS Duration: 78 QT Interval:  354 QTC Calculation: 411 R Axis:   76 Text Interpretation:  Sinus rhythm Probable left atrial enlargement  Anteroseptal infarct, old Nonspecific T abnormalities, lateral leads  Confirmed by Lincoln Brigham [phone]) on 07/21/2015 9:50:21 PM      MDM   Final diagnoses:  Atypical chest pain    Pt seen at multiple facilities over the past few days for same symptoms. Negative cardiac workup. Workup here completely negative. Low suspicion for ACS, PE, infectious etiology, intrabdominal etiology.  HEART score 2. I suspect pt has some psychiatric/anxietal component to his symptoms. Will delta trop. If remains negative will d/c home with PCP f/u.   Delta trop negative. Will d/c home with PCP f/u. ER return precautions given.   Carlene Coria, PA-C 07/22/15  0050  Tilden Fossa, MD 07/23/15 [phone]

## 2015-07-21 NOTE — Progress Notes (Signed)
[facility] [place] [facility]   PATIENT NAME: Paul MornDavid Thompson    MR#:  [id number]  DATE OF BIRTH:  01/31/1961  SUBJECTIVE:  CHIEF COMPLAINT:   Chief Complaint  Patient presents with  . Chest Pain   -Admitted with chest pressure that has completely resolved now. Troponins have been negative so far. -For stress test this morning. -Patient thinks his chest pressure started after inhaling a chemical irritant  REVIEW OF SYSTEMS:  Review of Systems  Constitutional: Negative for fever and chills.  HENT: Negative for ear discharge, ear pain and nosebleeds.   Eyes: Negative for blurred vision, double vision and photophobia.       Irritation of eyes with discharge.  Respiratory: Negative for cough, shortness of breath and wheezing.   Cardiovascular: Positive for chest pain. Negative for palpitations and leg swelling.  Gastrointestinal: Negative for nausea, vomiting, abdominal pain, diarrhea and constipation.  Genitourinary: Negative for dysuria and urgency.  Musculoskeletal: Negative for myalgias.  Neurological: Negative for dizziness, tremors, sensory change, speech change, focal weakness, seizures, weakness and headaches.  Psychiatric/Behavioral: Negative for depression.    DRUG ALLERGIES:  No Known Allergies  VITALS:  Blood pressure 112/69, pulse 57, temperature 98.2 F (36.8 C), temperature source Oral, resp. rate 24, height 5\' 7"  (1.702 m), weight 71.305 kg (157 lb 3.2 oz), SpO2 97 %.  PHYSICAL EXAMINATION:  Physical Exam  GENERAL:  54 y.o.-year-old patient lying in the bed with no acute distress.  EYES: Pupils equal, round, reactive to light and accommodation. No scleral icterus. Extraocular muscles intact.  HEENT: Head atraumatic, normocephalic. Oropharynx and nasopharynx clear.  NECK:  Supple, no jugular venous distention. No thyroid enlargement, no tenderness.  LUNGS: Normal breath sounds bilaterally, no wheezing, rales,rhonchi or crepitation. No  use of accessory muscles of respiration.  CARDIOVASCULAR: S1, S2 normal. No murmurs, rubs, or gallops.  ABDOMEN: Soft, nontender, nondistended. Bowel sounds present. No organomegaly or mass.  EXTREMITIES: No pedal edema, cyanosis, or clubbing.  NEUROLOGIC: Cranial nerves II through XII are intact. Muscle strength 5/5 in all extremities. Sensation intact. Gait not checked.  PSYCHIATRIC: The patient is alert and oriented x 3.  SKIN: No obvious rash, lesion, or ulcer.    LABORATORY PANEL:   CBC  Recent Labs Lab 07/21/15 0404  WBC 6.0  HGB 13.6  HCT 41.7  PLT 214   ------------------------------------------------------------------------------------------------------------------  Chemistries   Recent Labs Lab 07/21/15 0404  NA 143  K 3.9  CL 109  CO2 29  GLUCOSE 105*  BUN 14  CREATININE 1.29*  CALCIUM 9.0   ------------------------------------------------------------------------------------------------------------------  Cardiac Enzymes  Recent Labs Lab 07/21/15 0957  TROPONINI <0.03   ------------------------------------------------------------------------------------------------------------------  RADIOLOGY:  Dg Chest 2 View  07/20/2015  CLINICAL DATA:  Acute onset of generalized chest pain and nausea. Initial encounter. EXAM: CHEST  2 VIEW COMPARISON:  None. FINDINGS: The lungs are well-aerated and clear. There is no evidence of focal opacification, pleural effusion or pneumothorax. The heart is normal in size; the mediastinal contour is within normal limits. No acute osseous abnormalities are seen. IMPRESSION: No acute cardiopulmonary process seen. Electronically Signed   By: Roanna RaiderJeffery  Chang M.D.   On: 07/20/2015 21:57    EKG:   Orders placed or performed during the hospital encounter of 07/20/15  . ED EKG within 10 minutes  . ED EKG within 10 minutes    ASSESSMENT AND PLAN:   54 year old male with past medical history of hypertension presents to the  hospital secondary to  chest pain.  #1 chest pain-started after inhaling chemical irritants. Possible T-wave inversions noted in lateral leads yesterday. -Patient denies any chest pain now. Troponins have been negative twice -Order stress test, if that is negative can be discharged home. -Chest x-ray with clear lungs. Patient is not hypoxic and does not have any pleuritic chest pain. - discontinue IV heparin  #2 Dry eyes-eyedrops as needed  #3 HTN- not on any home meds. Elevated last night, normal now. Sinus bradycardia on monitor - outpatient follow up recommended  #4 DVT Prophylaxis- discontinue IV heparin. encourage ambulation   All the records are reviewed and case discussed with Care Management/Social Workerr. Management plans discussed with the patient, family and they are in agreement.  CODE STATUS: Full Code  TOTAL TIME TAKING CARE OF THIS PATIENT: 37 minutes.   POSSIBLE D/C IN 1 DAY, DEPENDING ON CLINICAL CONDITION.   Enid Baas M.D on 07/21/2015 at 10:51 AM  Between 7am to 6pm - Pager - [phone]  After 6pm go to [email or web address] - password [email or web address] [facility]  [place] [facility]  Office  [phone]  CC: Primary care physician; No primary care provider on file.

## 2015-07-21 NOTE — Discharge Summary (Signed)
[facility] [place] [facility]   PATIENT NAME: Paul MornDavid Thompson    MR#:  [id number]  DATE OF BIRTH:  01/30/1961  DATE OF ADMISSION:  07/20/2015 ADMITTING PHYSICIAN: Oralia Manisavid Willis, MD  DATE OF DISCHARGE: 07/21/15  PRIMARY CARE PHYSICIAN: No primary care provider on file.    ADMISSION DIAGNOSIS:  Unstable angina (HCC) [I20.0]  DISCHARGE DIAGNOSIS:  Principal Problem:   Musculoskeletal chest pain Active Problems:   HTN (hypertension)   Nonspecific ST-T wave electrocardiographic changes   SECONDARY DIAGNOSIS:   Past Medical History  Diagnosis Date  . Hypertension     HOSPITAL COURSE:   54 year old male with past medical history of hypertension presents to the hospital secondary to chest pain.  #1 chest pain-started after inhaling chemical irritants.  - Nonspecefic T-wave inversions noted in lateral leads on admission. -Patient denies any chest pain now. Troponins have been negative twice -Normal Myoview- patient exercised fine on treadmill, no chest pain or EKG changes. -Chest x-ray with clear lungs. Patient is not hypoxic and does not have any pleuritic chest pain. discharge home today  #2 Dry eyes-eyedrops as needed  #3 HTN- not on any home meds. normal now. Sinus bradycardia on monitor - outpatient follow up recommended   Discharge home today   DISCHARGE CONDITIONS:   stable  CONSULTS OBTAINED:  Treatment Team:  Lamar BlinksBruce J Kowalski, MD  DRUG ALLERGIES:  No Known Allergies  DISCHARGE MEDICATIONS:  There are no discharge medications for this patient.    DISCHARGE INSTRUCTIONS:   1. PCP f/u in 1 week  If you experience worsening of your admission symptoms, develop shortness of breath, life threatening emergency, suicidal or homicidal thoughts you must seek medical attention immediately by calling 911 or calling your MD immediately  if symptoms less severe.  You Must read complete instructions/literature along with all the  possible adverse reactions/side effects for all the Medicines you take and that have been prescribed to you. Take any new Medicines after you have completely understood and accept all the possible adverse reactions/side effects.   Please note  You were cared for by a hospitalist during your hospital stay. If you have any questions about your discharge medications or the care you received while you were in the hospital after you are discharged, you can call the unit and asked to speak with the hospitalist on call if the hospitalist that took care of you is not available. Once you are discharged, your primary care physician will handle any further medical issues. Please note that NO REFILLS for any discharge medications will be authorized once you are discharged, as it is imperative that you return to your primary care physician (or establish a relationship with a primary care physician if you do not have one) for your aftercare needs so that they can reassess your need for medications and monitor your lab values.    Today   CHIEF COMPLAINT:   Chief Complaint  Patient presents with  . Chest Pain    VITAL SIGNS:  Blood pressure 112/69, pulse 57, temperature 98.2 F (36.8 C), temperature source Oral, resp. rate 24, height 5\' 7"  (1.702 m), weight 71.305 kg (157 lb 3.2 oz), SpO2 97 %.  I/O:   Intake/Output Summary (Last 24 hours) at 07/21/15 1402 Last data filed at 07/21/15 1224  Gross per 24 hour  Intake  54.85 ml  Output    400 ml  Net -345.15 ml    PHYSICAL EXAMINATION:   Physical Exam  GENERAL:  54 y.o.-year-old patient lying in the bed with no acute distress.  EYES: Pupils equal, round, reactive to light and accommodation. No scleral icterus. Extraocular muscles intact.  HEENT: Head atraumatic, normocephalic. Oropharynx and nasopharynx clear.  NECK: Supple, no jugular venous distention. No thyroid enlargement, no tenderness.  LUNGS: Normal breath sounds bilaterally, no  wheezing, rales,rhonchi or crepitation. No use of accessory muscles of respiration.  CARDIOVASCULAR: S1, S2 normal. No murmurs, rubs, or gallops.  ABDOMEN: Soft, nontender, nondistended. Bowel sounds present. No organomegaly or mass.  EXTREMITIES: No pedal edema, cyanosis, or clubbing.  NEUROLOGIC: Cranial nerves II through XII are intact. Muscle strength 5/5 in all extremities. Sensation intact. Gait not checked.  PSYCHIATRIC: The patient is alert and oriented x 3.  SKIN: No obvious rash, lesion, or ulcer.   DATA REVIEW:   CBC  Recent Labs Lab 07/21/15 0404  WBC 6.0  HGB 13.6  HCT 41.7  PLT 214    Chemistries   Recent Labs Lab 07/21/15 0404  NA 143  K 3.9  CL 109  CO2 29  GLUCOSE 105*  BUN 14  CREATININE 1.29*  CALCIUM 9.0    Cardiac Enzymes  Recent Labs Lab 07/21/15 0957  TROPONINI <0.03    Microbiology Results  No results found for this or any previous visit.  RADIOLOGY:  Dg Chest 2 View  07/20/2015  CLINICAL DATA:  Acute onset of generalized chest pain and nausea. Initial encounter. EXAM: CHEST  2 VIEW COMPARISON:  None. FINDINGS: The lungs are well-aerated and clear. There is no evidence of focal opacification, pleural effusion or pneumothorax. The heart is normal in size; the mediastinal contour is within normal limits. No acute osseous abnormalities are seen. IMPRESSION: No acute cardiopulmonary process seen. Electronically Signed   By: Roanna Raider M.D.   On: 07/20/2015 21:57    EKG:   Orders placed or performed during the hospital encounter of 07/20/15  . ED EKG within 10 minutes  . ED EKG within 10 minutes      Management plans discussed with the patient, family and they are in agreement.  CODE STATUS:     Code Status Orders        Start     Ordered   07/20/15 2359  Full code   Continuous     07/20/15 2358      TOTAL TIME TAKING CARE OF THIS PATIENT: 38 minutes.    Enid Baas M.D on 07/21/2015 at 2:02  PM  Between 7am to 6pm - Pager - [phone]  After 6pm go to [email or web address] - password [email or web address] [email or web address]  [place] [facility]  Office  [phone]  CC: Primary care physician; No primary care provider on file.

## 2015-07-21 NOTE — Progress Notes (Signed)
MD ordered to discontinue heparin drip. Patient is downstairs in nuclear medicine for stress test. Called down there and they stated the RN down there will turn the IV pump off.

## 2015-07-21 NOTE — Progress Notes (Signed)
Report received from Leah RN. Patient resting quietly with no complaints. Will continue to monitor.  

## 2015-07-21 NOTE — Progress Notes (Signed)
Patient given discharge teaching and paperwork regarding medications, diet, follow-up appointments and activity. Patient understanding verbalized. No complaints at this time. IV and telemetry discontinued prior to leaving. Skin assessment as previously charted and vitals are stable; on room air. Patient being discharged to home. No new prescriptions.  

## 2015-07-22 LAB — NM MYOCAR MULTI W/SPECT W/WALL MOTION / EF
CHL CUP NUCLEAR SSS: 1
CSEPED: 12 min
CSEPEDS: 0 s
CSEPPHR: 179 {beats}/min
Estimated workload: 13.4 METS
LVDIAVOL: 55 mL
LVSYSVOL: 15 mL
NUC STRESS TID: 0.66
Rest HR: 68 {beats}/min
SDS: 0
SRS: 1

## 2015-07-22 LAB — I-STAT TROPONIN, ED: Troponin i, poc: 0.01 ng/mL (ref 0.00–0.08)

## 2015-07-22 NOTE — Discharge Instructions (Signed)
[facility] today for evaluation of chest pain. Your workup was completely unremarkable. It is very unlikely that your symptoms are due to your heart. As we discussed, many things can cause chest pressure/pain including anxiety/stress, acid reflux, and musculoskeletal pain. Please call your primary care provider to schedule a follow-up appointment within one week. Return to the ER for new or worsening symptoms.

## 2016-05-14 ENCOUNTER — Encounter: Payer: Self-pay | Admitting: Gynecology

## 2016-05-14 ENCOUNTER — Emergency Department: Payer: 59

## 2016-05-14 ENCOUNTER — Encounter: Payer: Self-pay | Admitting: Emergency Medicine

## 2016-05-14 ENCOUNTER — Emergency Department
Admission: EM | Admit: 2016-05-14 | Discharge: 2016-05-14 | Disposition: A | Discharge status: Home / Self Care | Payer: 59 | Attending: Emergency Medicine | Admitting: Emergency Medicine

## 2016-05-14 ENCOUNTER — Ambulatory Visit (INDEPENDENT_AMBULATORY_CARE_PROVIDER_SITE_OTHER)
Admission: EM | Admit: 2016-05-14 | Discharge: 2016-05-14 | Disposition: A | Discharge status: Hospice | Payer: 59 | Source: Home / Self Care

## 2016-05-14 DIAGNOSIS — Z79899 Other long term (current) drug therapy: Secondary | ICD-10-CM

## 2016-05-14 DIAGNOSIS — R0602 Shortness of breath: Secondary | ICD-10-CM

## 2016-05-14 DIAGNOSIS — R0789 Other chest pain: Secondary | ICD-10-CM

## 2016-05-14 DIAGNOSIS — I10 Essential (primary) hypertension: Secondary | ICD-10-CM | POA: Insufficient documentation

## 2016-05-14 DIAGNOSIS — R079 Chest pain, unspecified: Secondary | ICD-10-CM | POA: Diagnosis not present

## 2016-05-14 LAB — CBC WITH DIFFERENTIAL/PLATELET
BASOS ABS: 0.1 10*3/uL (ref 0–0.1)
Basophils Relative: 1 %
Eosinophils Absolute: 0.1 10*3/uL (ref 0–0.7)
Eosinophils Relative: 2 %
HEMATOCRIT: 46.5 % (ref 40.0–52.0)
HEMOGLOBIN: 15.2 g/dL (ref 13.0–18.0)
LYMPHS PCT: 17 %
Lymphs Abs: 1.2 10*3/uL (ref 1.0–3.6)
MCH: 29.5 pg (ref 26.0–34.0)
MCHC: 32.7 g/dL (ref 32.0–36.0)
MCV: 90.1 fL (ref 80.0–100.0)
MONO ABS: 0.7 10*3/uL (ref 0.2–1.0)
Monocytes Relative: 11 %
NEUTROS ABS: 4.9 10*3/uL (ref 1.4–6.5)
NEUTROS PCT: 69 %
Platelets: 234 10*3/uL (ref 150–440)
RBC: 5.16 MIL/uL (ref 4.40–5.90)
RDW: 13.7 % (ref 11.5–14.5)
WBC: 7.1 10*3/uL (ref 3.8–10.6)

## 2016-05-14 LAB — TROPONIN I
Troponin I: <0.03 ng/mL (ref <0.03)
Troponin I: <0.03 ng/mL (ref <0.03)

## 2016-05-14 LAB — COMPREHENSIVE METABOLIC PANEL
ALBUMIN: 4.2 g/dL (ref 3.5–5.0)
ALT: 29 U/L (ref 17–63)
AST: 26 U/L (ref 15–41)
Alkaline Phosphatase: 56 U/L (ref 38–126)
Anion gap: 7 (ref 5–15)
BILIRUBIN TOTAL: 1.8 mg/dL — AB (ref 0.3–1.2)
BUN: 22 mg/dL — AB (ref 6–20)
CALCIUM: 9 mg/dL (ref 8.9–10.3)
CO2: 28 mmol/L (ref 22–32)
Chloride: 101 mmol/L (ref 101–111)
Creatinine, Ser: 1.3 mg/dL — ABNORMAL HIGH (ref 0.61–1.24)
GFR calc Af Amer: >60 mL/min (ref >60)
GFR calc non Af Amer: 60 mL/min — ABNORMAL LOW (ref >60)
GLUCOSE: 97 mg/dL (ref 65–99)
Potassium: 4 mmol/L (ref 3.5–5.1)
SODIUM: 136 mmol/L (ref 135–145)
TOTAL PROTEIN: 6.9 g/dL (ref 6.5–8.1)

## 2016-05-14 LAB — LIPASE, BLOOD: Lipase: 38 U/L (ref 11–51)

## 2016-05-14 MED ORDER — ASPIRIN 81 MG PO CHEW
324.0000 mg | CHEWABLE_TABLET | Freq: Once | ORAL | Status: AC
  Administered 2016-05-14: 324 mg via ORAL

## 2016-05-14 NOTE — ED Triage Notes (Signed)
Sudden onset left chest pain while at rest, pain radiates to left shoulder. Also, mild dyspnea, nausea, and gen weakness. Previous hx of hypertension only. Rates pain 4/10.

## 2016-05-14 NOTE — ED Provider Notes (Signed)
CSN: [id number]     Arrival date & time 05/14/16  1446 History   First MD Initiated Contact with Patient 05/14/16 1521     Chief Complaint  Patient presents with  . Chest Pain   (Consider location/radiation/quality/duration/timing/severity/associated sxs/prior Treatment) HPI  This a 55 year old black male was at work today sitting at computer when he had the sudden onset of left chest pain he describes as a pressure sensation. It was nonradiating and lasted about 30 minutes. Dates he got up to walk around and seemed to subside somewhat but after he sat down and started working again and began to hurt again in the same distribution and described as pressure. He states he came to our facility and had a brief episode of nausea but has had some shortness of breath. In review of his previous history he had a similar episode on 07/20/2015 was admitted to the hospital underwent a stress test by Dr. Gwen PoundsKowalski which was interpreted as normal. He has been relatively asymptomatic since that time. EKG today shows little change in comparison to his previous EKGs. He does have some peaked T waves V3-V4 lead.Thereare no acute changes seen.    Past Medical History:  Diagnosis Date  . Hypertension    Past Surgical History:  Procedure Laterality Date  . NO PAST SURGERIES     Family History  Problem Relation Age of Onset  . Diabetes     Social History  Substance Use Topics  . Smoking status: Never Smoker  . Smokeless tobacco: Not on file  . Alcohol use No    Review of Systems  Constitutional: Positive for activity change. Negative for appetite change, chills, fatigue and fever.  Respiratory: Positive for chest tightness and shortness of breath.   Cardiovascular: Positive for chest pain.  All other systems reviewed and are negative.   Allergies  Review of patient's allergies indicates no known allergies.  Home Medications   Prior to Admission medications   Medication Sig Start Date End Date  Taking? Authorizing Provider  acetaminophen (TYLENOL) 500 MG tablet Take 1,000 mg by mouth every 6 (six) hours as needed for mild pain.    Historical Provider, MD   Meds Ordered and Administered this Visit   Medications  aspirin chewable tablet 324 mg (324 mg Oral Given 05/14/16 1516)    BP (!) 152/86 (BP Location: Left Arm)   Pulse 74   Temp 98.1 F (36.7 C) (Oral)   Ht 5\' 7"  (1.702 m)   Wt 155 lb (70.3 kg)   SpO2 97%   BMI 24.28 kg/m  No data found.   Physical Exam  Constitutional: He is oriented to person, place, and time. He appears well-developed and well-nourished. No distress.  HENT:  Head: Normocephalic and atraumatic.  Eyes: EOM are normal. Pupils are equal, round, and reactive to light.  Neck: Normal range of motion. Neck supple.  Cardiovascular: Normal rate, regular rhythm and normal heart sounds.  Exam reveals no gallop and no friction rub.   No murmur heard. Pulmonary/Chest: Effort normal and breath sounds normal. No respiratory distress. He has no wheezes. He has no rales. He exhibits no tenderness.  Is no chest wall tenderness elicited.  Musculoskeletal: Normal range of motion.  Neurological: He is alert and oriented to person, place, and time.  Skin: Skin is warm and dry. He is not diaphoretic.  Psychiatric: He has a normal mood and affect. His behavior is normal. Judgment and thought content normal.  Nursing note and vitals reviewed.  Urgent Care Course   Clinical Course   Orders placed or performed during the hospital encounter of 05/14/16  . EKG 12-Lead  . EKG 12-Lead   Procedures (including critical care time)  Labs Review Labs Reviewed - No data to display  Imaging Review No results found.   Visual Acuity Review  Right Eye Distance:   Left Eye Distance:   Bilateral Distance:    Right Eye Near:   Left Eye Near:    Bilateral Near:     Discharge Medication List as of 05/14/2016  3:36 PM     Medications  aspirin chewable tablet  324 mg (324 mg Oral Given 05/14/16 1516)   EKG:  normal sinus rhythm, unchanged from previous tracings No.Acute changes.Peaked T waves V2 V3 V4   MDM   1. Chest pain, unspecified type    Discussion with the patient he agreed for transport to [facility] for further evaluation and treatment.    Lutricia Feil, PA-C 05/14/16 1540

## 2016-05-14 NOTE — ED Notes (Signed)
Pt transported to [facility] ED via [facility]. 

## 2016-05-14 NOTE — ED Provider Notes (Signed)
[facility] Emergency Department Provider Note  ____________________________________________   I have reviewed the triage vital signs and the nursing notes.   HISTORY  Chief Complaint Chest Pain    HPI Paul Thompson is a 55 y.o. male who has a history ofhypertension, well-controlled, no history of high cholesterol no personal or family history of PE or DVT no personal or family history of ACS, had a recent outpatient stress test less than a year ago for atypical chest pain which was reassuring. He presents today complaining of having no pain at this time but for a very brief period at around noon he had a bout 20 minutes of a strange pressure-like sensation in the upper side of his left anterior chest. It was worse when he changed his arm position. The patient states that it happened at rest while sitting. It did not radiate to his arm. He got up and walked around and actually exerting himself made it go away. He denies shortness of breath to me although he mentioned it to that minor care physician. Patient has had no chest pain since that time. No chest pain before that time, he has no exertional chest pain, he has no leg pain or swelling, no recent travel or surgery. The patient denies any symptoms at this juncture. He states that it was a nonradiating pressure like discomfort that was very minimal that he is vigilant about his health and wished to be checked out. States it was perhaps 1 or 2 out of 10.     Past Medical History:  Diagnosis Date  . Hypertension     Patient Active Problem List   Diagnosis Date Noted  . Musculoskeletal chest pain 07/20/2015  . HTN (hypertension) 07/20/2015  . Nonspecific ST-T wave electrocardiographic changes 07/20/2015    Past Surgical History:  Procedure Laterality Date  . NO PAST SURGERIES      Prior to Admission medications   Medication Sig Start Date End Date Taking? Authorizing Provider  acetaminophen (TYLENOL) 500  MG tablet Take 1,000 mg by mouth every 6 (six) hours as needed for mild pain.    Historical Provider, MD    Allergies Review of patient's allergies indicates no known allergies.  Family History  Problem Relation Age of Onset  . Diabetes      Social History Social History  Substance Use Topics  . Smoking status: Never Smoker  . Smokeless tobacco: Never Used  . Alcohol use No    Review of Systems Constitutional: No fever/chills Eyes: No visual changes. ENT: No sore throat. No stiff neck no neck pain Cardiovascular: Denies chest pain. Respiratory: Denies shortness of breath. Gastrointestinal:   no vomiting.  No diarrhea.  No constipation. Genitourinary: Negative for dysuria. Musculoskeletal: Negative lower extremity swelling Skin: Negative for rash. Neurological: Negative for severe headaches, focal weakness or numbness. 10-point ROS otherwise negative.  ____________________________________________   PHYSICAL EXAM:  VITAL SIGNS: ED Triage Vitals [05/14/16 1603]  Enc Vitals Group     BP (!) 144/89     Pulse Rate 70     Resp 16     Temp 98.1 F (36.7 C)     Temp src      SpO2 100 %     Weight      Height      Head Circumference      Peak Flow      Pain Score      Pain Loc      Pain Edu?  Excl. in GC?     Constitutional: Alert and oriented. Well appearing and in no acute distress. Eyes: Conjunctivae are normal. PERRL. EOMI. Head: Atraumatic. Nose: No congestion/rhinnorhea. Mouth/Throat: Mucous membranes are moist.  Oropharynx non-erythematous. Neck: No stridor.   Nontender with no meningismus Cardiovascular: Normal rate, regular rhythm. Grossly normal heart sounds.  Good peripheral circulation. Respiratory: Normal respiratory effort.  No retractions. Lungs CTAB. Chest: There is minimal topalpation left upper chest wall which reproduces the patient's pain in the pectoralis region. When chest area patient says "ouch that's the pain right there" and pulls  back. There is no shingles there is no flail chest there is no crepitus. Abdominal: Soft and nontender. No distention. No guarding no rebound Back:  There is no focal tenderness or step off.  there is no midline tenderness there are no lesions noted. there is no CVA tenderness Musculoskeletal: No lower extremity tenderness, no upper extremity tenderness. No joint effusions, no DVT signs strong distal pulses no edema Neurologic:  Normal speech and language. No gross focal neurologic deficits are appreciated.  Skin:  Skin is warm, dry and intact. No rash noted. Psychiatric: Mood and affect are normal. Speech and behavior are normal.  ____________________________________________   LABS (all labs ordered are listed, but only abnormal results are displayed)  Labs Reviewed  COMPREHENSIVE METABOLIC PANEL - Abnormal; Notable for the following:       Result Value   BUN 22 (*)    Creatinine, Ser 1.30 (*)    Total Bilirubin 1.8 (*)    GFR calc non Af Amer 60 (*)    All other components within normal limits  CBC WITH DIFFERENTIAL/PLATELET  TROPONIN I  LIPASE, BLOOD  TROPONIN I   ____________________________________________  EKG  I personally interpreted any EKGs ordered by me or triage EKG shows normal sinus rhythm at 56 bpm, mild bradycardia noted. Repolarization under Melanie noted. No acute ST elevation or depression. ____________________________________________  RADIOLOGY  I reviewed any imaging ordered by me or triage that were performed during my shift and, if possible, patient and/or family made aware of any abnormal findings. ____________________________________________   PROCEDURES  Procedure(s) performed: None  Procedures  Critical Care performed: None  ____________________________________________   INITIAL IMPRESSION / ASSESSMENT AND PLAN / ED COURSE  Pertinent labs & imaging results that were available during my care of the patient were reviewed by me and  considered in my medical decision making (see chart for details).  In the very atypical story for reproducible chest pain, he has no discomfort since noon. 2 negative troponins. Reassuring EKG. He is perk negative and I do not believe he has a PE. I do not believe this represented dissection.At this time, there does not appear to be clinical evidence to support the diagnosis of pulmonary embolus, dissection, myocarditis, endocarditis, pericarditis, pericardial tamponade, acute coronary syndrome, pneumothorax, pneumonia, or any other acute intrathoracic pathology that will require admission or acute intervention. Nor is there evidence of any significant intra-abdominal pathology causing this discomfort. We will have him follow closely as an outpatient with cardiology. We discussed admission but he states he would very much prefer to go home and I think this is not unreasonable given his very atypical history of chest pain at rest improves with walking around and is somewhat reproducible. He understands if at anytime he feels worse she is to come back.  Clinical Course   ____________________________________________   FINAL CLINICAL IMPRESSION(S) / ED DIAGNOSES  Final diagnoses:  None  This chart was dictated using voice recognition software.  Despite best efforts to proofread,  errors can occur which can change meaning.      Jeanmarie Plant, MD 05/14/16 [phone]

## 2016-05-14 NOTE — ED Notes (Signed)
ek[facility] sent from  [facility].

## 2016-05-14 NOTE — ED Triage Notes (Signed)
Pt states left chest pain while sitting at his workstation at work. No longer in pain. Hx of angina with a clean heart cath 1 yr ago. No chest pain with his angina for the last 10 months.

## 2017-04-06 IMAGING — CR DG CHEST 2V
1 series · 2 of 2 positions shown · non-contrast
Comparison: 07/21/2015

CLINICAL DATA: Chest pain starting today

EXAM:
CHEST  2 VIEW

[Series 1: dg chest 2 view · 0.14mm/px · 2 of 2 slices shown]
[im 1/2]
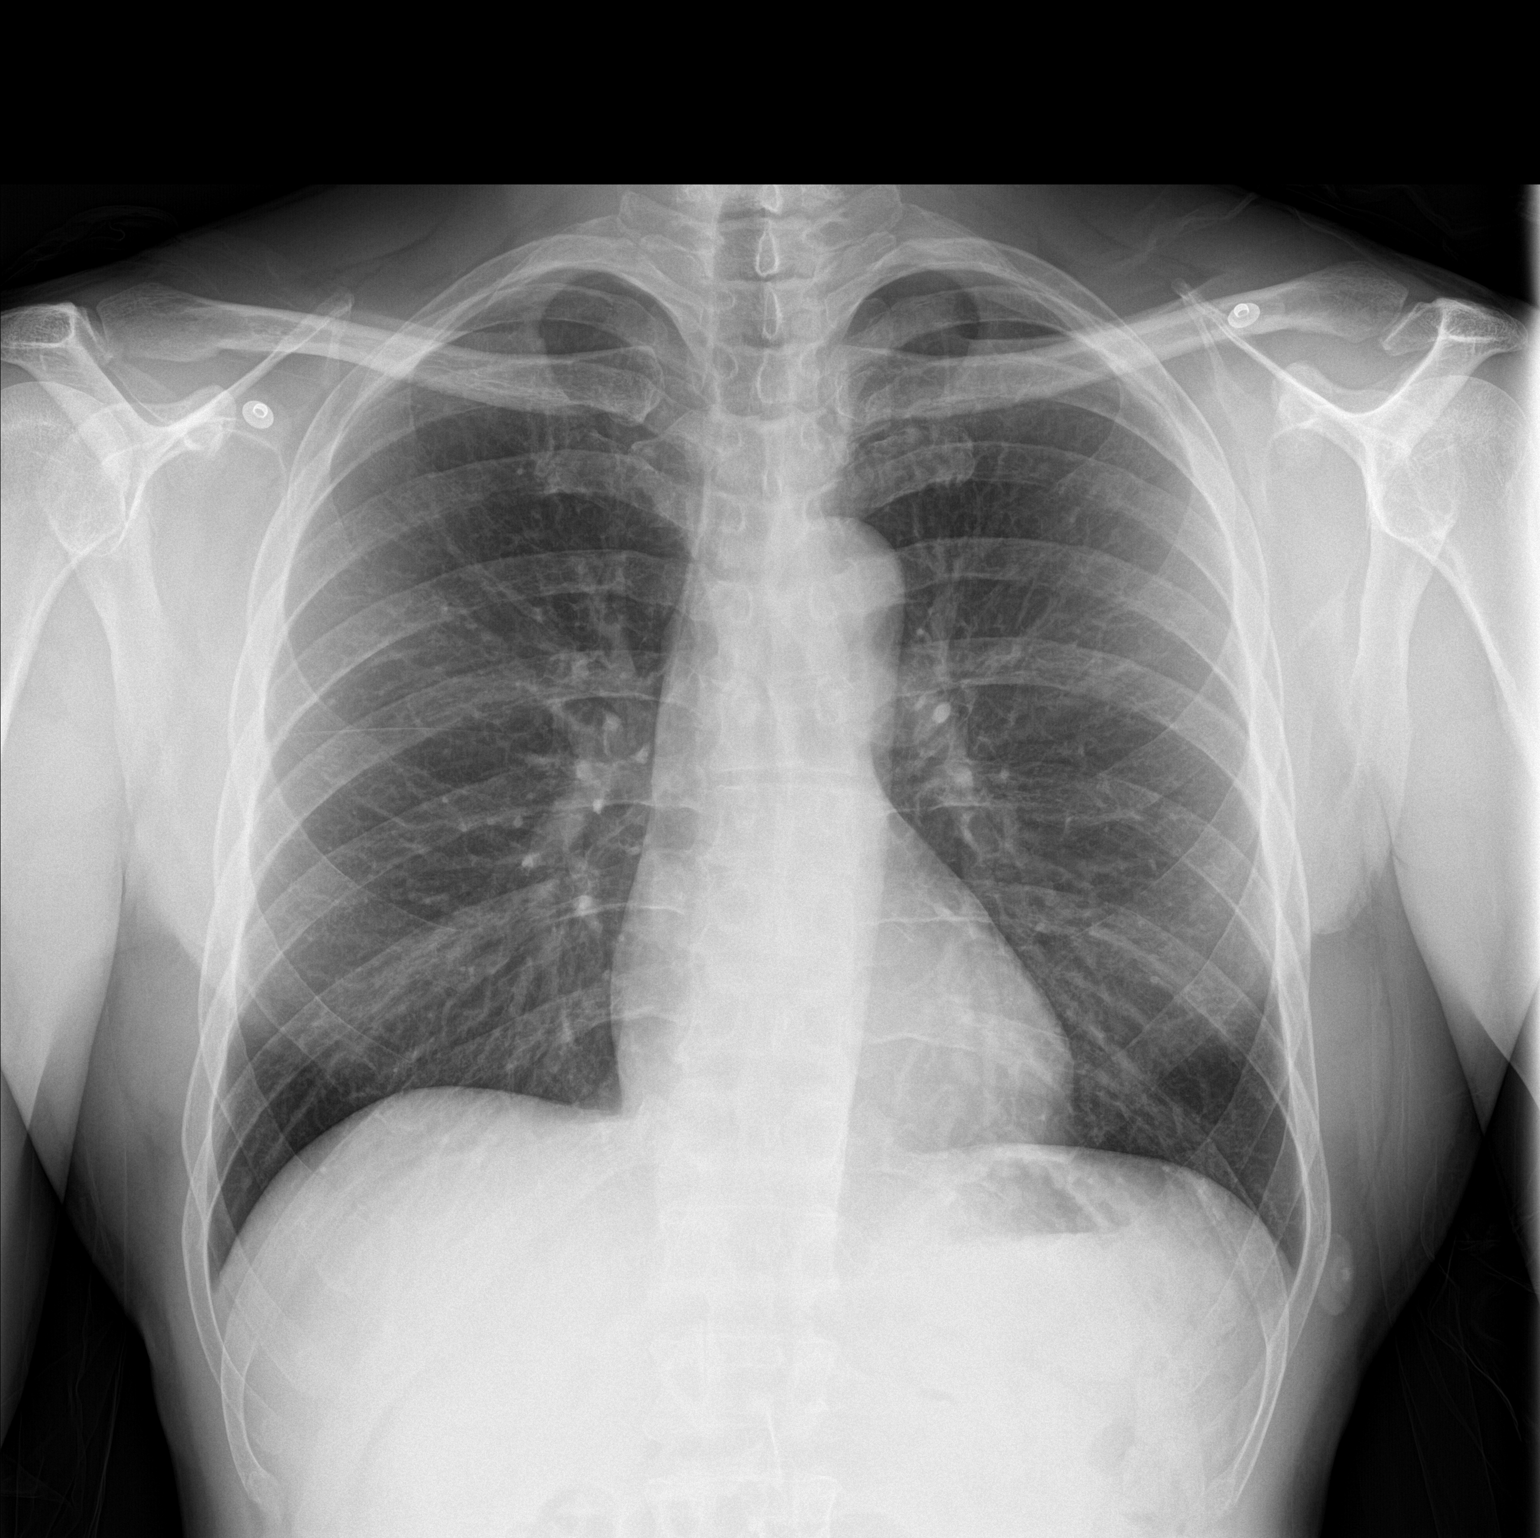
[im 2/2]
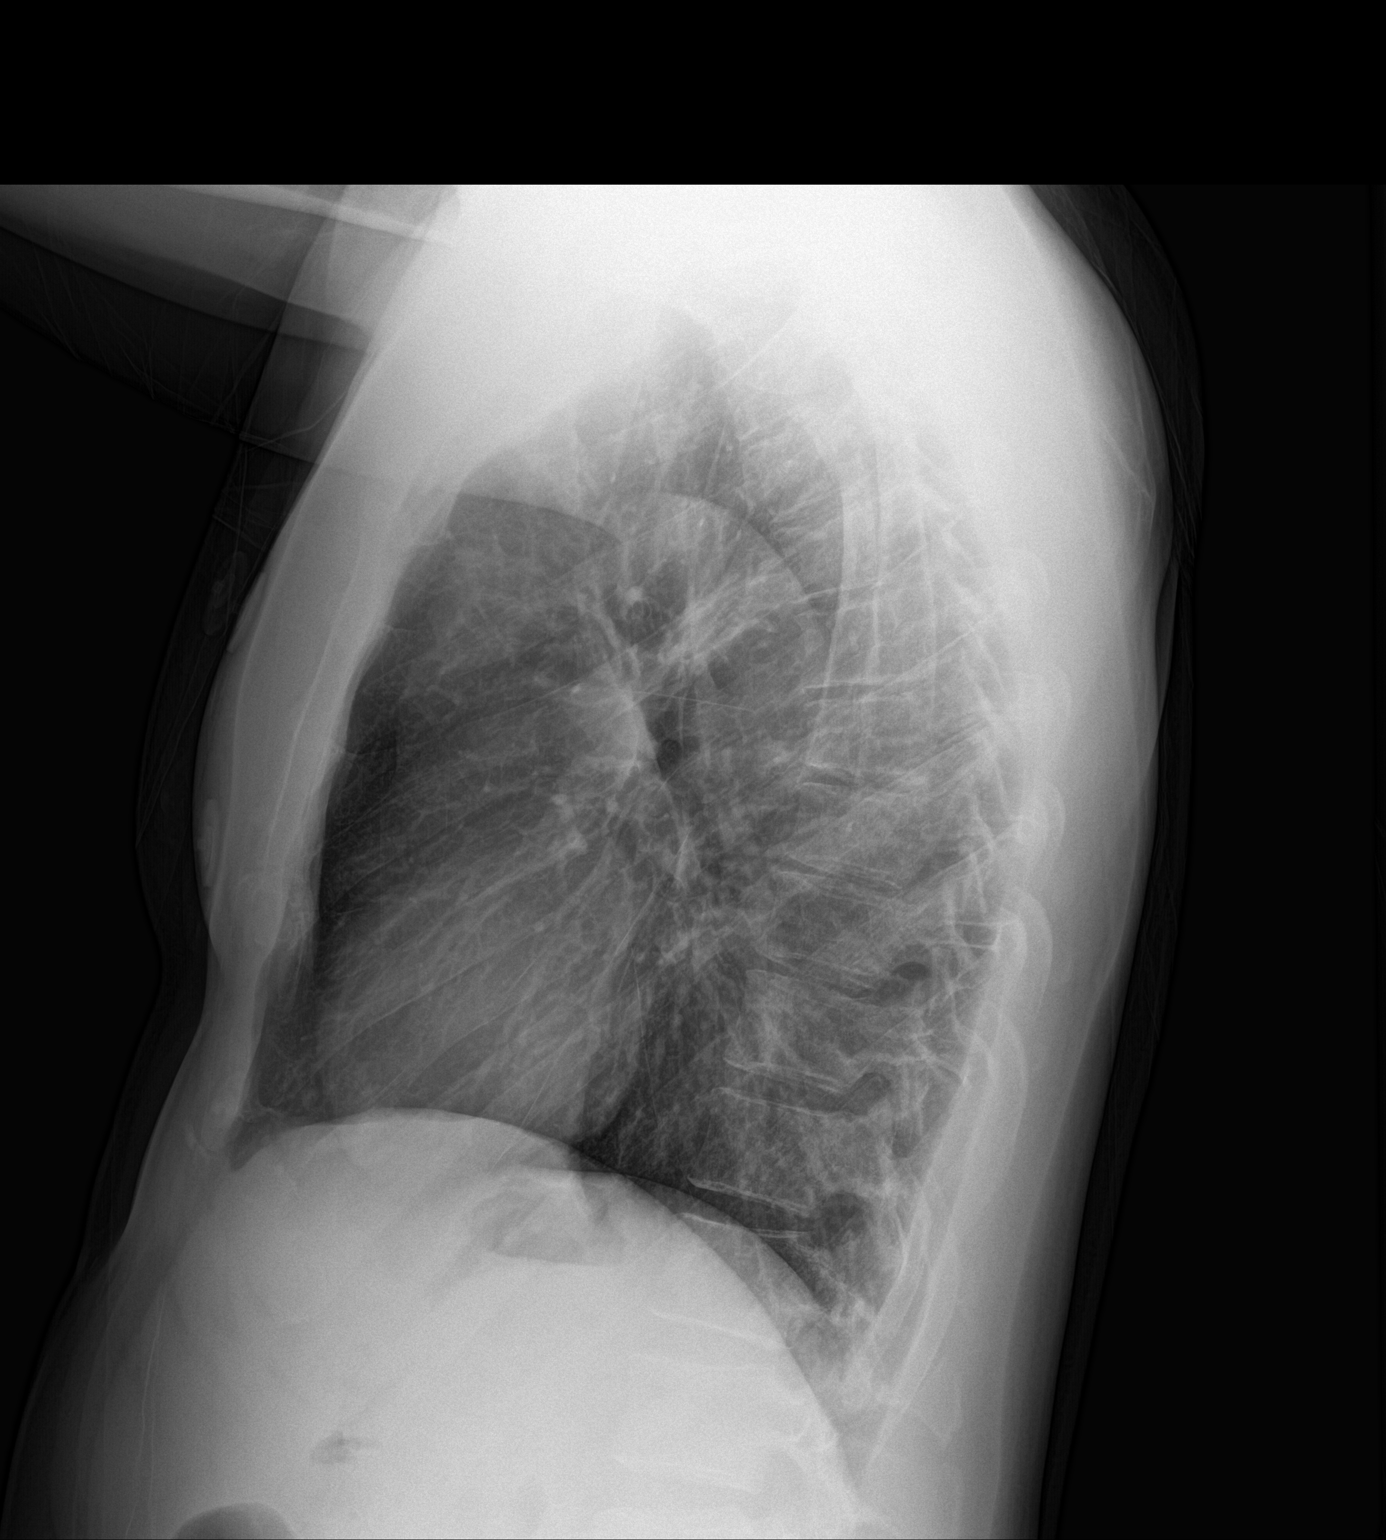

[2 of 2 positions shown; findings below may reference images not displayed]

FINDINGS: The heart size and mediastinal contours are within normal limits.
Both lungs are clear. The visualized skeletal structures are
unremarkable.
IMPRESSION: No active cardiopulmonary disease.

## 2018-07-24 ENCOUNTER — Encounter: Payer: Self-pay | Admitting: Emergency Medicine

## 2018-07-24 ENCOUNTER — Ambulatory Visit
Admission: EM | Admit: 2018-07-24 | Discharge: 2018-07-24 | Disposition: A | Discharge status: Home / Self Care | Payer: 59 | Attending: Family Medicine | Admitting: Family Medicine

## 2018-07-24 ENCOUNTER — Other Ambulatory Visit: Payer: Self-pay

## 2018-07-24 DIAGNOSIS — Z79899 Other long term (current) drug therapy: Secondary | ICD-10-CM | POA: Diagnosis not present

## 2018-07-24 DIAGNOSIS — I10 Essential (primary) hypertension: Secondary | ICD-10-CM | POA: Diagnosis not present

## 2018-07-24 DIAGNOSIS — R0789 Other chest pain: Secondary | ICD-10-CM | POA: Diagnosis present

## 2018-07-24 DIAGNOSIS — Z888 Allergy status to other drugs, medicaments and biological substances status: Secondary | ICD-10-CM | POA: Diagnosis not present

## 2018-07-24 NOTE — ED Provider Notes (Signed)
[facility]    CSN: [id number] Arrival date & time: 07/24/18  1807     History   Chief Complaint Chief Complaint  Patient presents with  . Chest Pain    HPI Paul Thompson is a 57 y.o. male.   57 yo male with a c/o chest pain that started earlier today. Patient states pain "feels like my chest muscles" and states he's been doing chest exercises. Also states his PCP has mentioned the possibility of anxiety. Patient has a h/o multiple urgent visits for chest pains and has had negative work ups including 2 weeks ago in the Emergency Department as well as a normal (negative) stress test about 6 weeks ago. Also has seen his cardiologist within the past month.   The history is provided by the patient.    Past Medical History:  Diagnosis Date  . Hypertension     Patient Active Problem List   Diagnosis Date Noted  . Musculoskeletal chest pain 07/20/2015  . HTN (hypertension) 07/20/2015  . Nonspecific ST-T wave electrocardiographic changes 07/20/2015    Past Surgical History:  Procedure Laterality Date  . NO PAST SURGERIES         Home Medications    Prior to Admission medications   Medication Sig Start Date End Date Taking? Authorizing Provider  amLODipine (NORVASC) 5 MG tablet Take by mouth. 06/20/18  Yes [provider]  sildenafil (REVATIO) 20 MG tablet Take 1 to 2 tablet prior to sexual intercourse once a day 12/28/16  Yes [provider]  acetaminophen (TYLENOL) 500 MG tablet Take 1,000 mg by mouth every 6 (six) hours as needed for mild pain.    [provider]    Family History Family History  Problem Relation Age of Onset  . Diabetes Other     Social History Social History   Tobacco Use  . Smoking status: Never Smoker  . Smokeless tobacco: Never Used  Substance Use Topics  . Alcohol use: No    Alcohol/week: 0.0 standard drinks  . Drug use: No     Allergies   Lisinopril and Losartan potassium   Review of  Systems Review of Systems   Physical Exam Triage Vital Signs ED Triage Vitals  Enc Vitals Group     BP 07/24/18 1817 134/89     Pulse Rate 07/24/18 1817 69     Resp 07/24/18 1817 18     Temp 07/24/18 1817 98 F (36.7 C)     Temp Source 07/24/18 1817 Oral     SpO2 07/24/18 1817 100 %     Weight 07/24/18 1818 160 lb (72.6 kg)     Height 07/24/18 1818 5\' 7"  (1.702 m)     Head Circumference --      Peak Flow --      Pain Score --      Pain Loc --      Pain Edu? --      Excl. in GC? --    No data found.  Updated Vital Signs BP 134/89 (BP Location: Left Arm)   Pulse 69   Temp 98 F (36.7 C) (Oral)   Resp 18   Ht 5\' 7"  (1.702 m)   Wt 72.6 kg   SpO2 100%   BMI 25.06 kg/m   Visual Acuity Right Eye Distance:   Left Eye Distance:   Bilateral Distance:    Right Eye Near:   Left Eye Near:    Bilateral Near:  Physical Exam Vitals signs and nursing note reviewed.  Constitutional:      General: He is not in acute distress.    Appearance: He is well-developed. He is not toxic-appearing or diaphoretic.  HENT:     Head: Normocephalic and atraumatic.     Right Ear: Tympanic membrane, ear canal and external ear normal.     Left Ear: Tympanic membrane, ear canal and external ear normal.     Nose: Nose normal. No congestion or rhinorrhea.     Mouth/Throat:     Pharynx: Uvula midline. No oropharyngeal exudate.     Tonsils: No tonsillar abscesses.  Neck:     Musculoskeletal: Normal range of motion and neck supple.     Thyroid: No thyromegaly.     Trachea: No tracheal deviation.  Cardiovascular:     Rate and Rhythm: Normal rate and regular rhythm.     Heart sounds: Normal heart sounds.  Pulmonary:     Effort: Pulmonary effort is normal. No respiratory distress.     Breath sounds: Normal breath sounds. No stridor. No wheezing, rhonchi or rales.  Lymphadenopathy:     Cervical: No cervical adenopathy.  Skin:    General: Skin is warm and dry.     Findings: No rash.    Neurological:     Mental Status: He is alert.      UC Treatments / Results  Labs (all labs ordered are listed, but only abnormal results are displayed) Labs Reviewed - No data to display  EKG None  Radiology No results found.  Procedures ED EKG Date/Time: 07/24/2018 8:15 PM Performed by: Payton Mccallumonty, Callie Facey, MD Authorized by: Payton Mccallumonty, Saori Umholtz, MD   ECG reviewed by ED Physician in the absence of a cardiologist: yes   Previous ECG:    Previous ECG:  Compared to current   Similarity:  No change Interpretation:    Interpretation: normal   Rate:    ECG rate assessment: normal   Rhythm:    Rhythm: sinus rhythm   Ectopy:    Ectopy: none   QRS:    QRS axis:  Normal   QRS intervals:  Normal Conduction:    Conduction: normal   ST segments:    ST segments:  Normal T waves:    T waves: normal     (including critical care time)  Medications Ordered in UC Medications - No data to display  Initial Impression / Assessment and Plan / UC Course  I have reviewed the triage vital signs and the nursing notes.  Pertinent labs & imaging results that were available during my care of the patient were reviewed by me and considered in my medical decision making (see chart for details).      Final Clinical Impressions(s) / UC Diagnoses   Final diagnoses:  Atypical chest pain     Discharge Instructions     Follow up with Primary Care Provider and cardiologist    ED Prescriptions    None     1. ekg results and diagnosis reviewed with patient 2. Recommend patient continue current medications  3. Follow up with PCP and cardiologist 4. Follow-up prn    Controlled Substance Prescriptions [place] Controlled Substance Registry consulted? Not Applicable   Payton Mccallumonty, Okema Rollinson, MD 07/24/18 2023

## 2018-07-24 NOTE — ED Notes (Signed)
Patient denies pain and is resting comfortably.  

## 2018-07-24 NOTE — ED Triage Notes (Signed)
Pt c/o chest pain that started about an hour ago. Denies any sweats, nausea, and arm pain. He does mention that when he talks for a long time he gets short of breath, otherwise no shortness of breath. He has history of HTN. Pain subsided about 15 minutes ago. He says it got better resting in the waiting room.

## 2018-07-24 NOTE — Discharge Instructions (Signed)
Follow up with Primary Care Provider and cardiologist

## 2024-01-02 ENCOUNTER — Ambulatory Visit
Admission: EM | Admit: 2024-01-02 | Discharge: 2024-01-02 | Disposition: A | Discharge status: Home / Self Care | Attending: Internal Medicine | Admitting: Internal Medicine

## 2024-01-02 ENCOUNTER — Other Ambulatory Visit: Payer: Self-pay

## 2024-01-02 DIAGNOSIS — R079 Chest pain, unspecified: Secondary | ICD-10-CM

## 2024-01-02 DIAGNOSIS — R0602 Shortness of breath: Secondary | ICD-10-CM | POA: Diagnosis not present

## 2024-01-02 MED ORDER — ASPIRIN 81 MG PO CHEW
324.0000 mg | CHEWABLE_TABLET | Freq: Once | ORAL | Status: AC
  Administered 2024-01-02: 324 mg via ORAL

## 2024-01-02 NOTE — ED Notes (Signed)
 Patient is being discharged from the Urgent Care and sent to the Emergency Department via [employer] auto with wife. . Per Ashlee Hermanns PAC, patient is in need of higher level of care due to symptoms . Patient is aware and verbalizes understanding of plan of care.  Vitals:   01/02/24 1343  BP: (!) 147/87  Pulse: 75  Resp: 16  Temp: 98.6 F (37 C)  SpO2: 97%

## 2024-01-02 NOTE — ED Notes (Signed)
 Left with spouse to [facility] ER in [place].

## 2024-01-02 NOTE — ED Triage Notes (Signed)
 Patient C/O intermittent left shoulder pain radiating down left arm with some shortness of breath. Patient states he had some tingling down the left ar. Onset today.

## 2024-01-02 NOTE — ED Provider Notes (Signed)
 [facility]  Note:  This document was prepared using Dragon voice recognition software and may include unintentional dictation errors.  MRN: [id number] DOB: 01/06/1961 DATE: 01/02/24   Subjective:   Chief Complaint:  Chief Complaint  Patient presents with   Arm Pain     HPI: Paul Thompson is a 63 y.o. male presenting for left side chest pain with radiation into his left arm and shortness of breath less than one day. Patient states he was sitting when he suddenly started experiencing shortness of breath. He felt he was having a hard time completing a sentence due to the shortness of breath at this time. He states he also started experiencing left side chest pain/discomfort with radiation into his left shoulder and down his left arm at the same time. He states he rested and symptoms subsided. He felt the shortness of breat improved and the chest pain resolved. However, he states prior to his arrival, symptoms began again with less intensity. He reports some minor discomfort in his left chest/left shoulder as well as slight shortness of breath. PMH significant for HTN and stage 3 CKD. Reports similar episodes in the past "years" ago. He reports having a stress years ago as well that was normal at that time. Patient is a nonsmoker. Denies fever, nausea/vomiting, abdominal pain, diaphoresis. Endorses chest pain, left shoulder pain, left arm pain, shortness of breath. Presents NAD.  Prior to Admission medications   Medication Sig Start Date End Date Taking? Authorizing Provider  amLODipine (NORVASC) 5 MG tablet Take by mouth. 06/20/18   [provider]  sildenafil (REVATIO) 20 MG tablet Take 1 to 2 tablet prior to sexual intercourse once a day 12/28/16   [provider]     Allergies  Allergen Reactions   Lisinopril Other (See Comments) and Palpitations   Losartan Potassium Other (See Comments) and Anxiety    History:   Past Medical History:  Diagnosis Date    Hypertension      Past Surgical History:  Procedure Laterality Date   NO PAST SURGERIES      Family History  Problem Relation Age of Onset   Diabetes Other     Social History   Tobacco Use   Smoking status: Never   Smokeless tobacco: Never  Vaping Use   Vaping status: Never Used  Substance Use Topics   Alcohol  use: No    Alcohol /week: 0.0 standard drinks of alcohol    Drug use: No    Review of Systems  Constitutional:  Negative for fever.  Respiratory:  Positive for shortness of breath.   Cardiovascular:  Positive for chest pain.  Gastrointestinal:  Negative for abdominal pain, nausea and vomiting.  Musculoskeletal:  Positive for arthralgias.     Objective:   Vitals: BP (!) 147/87 (BP Location: Right Arm)   Pulse 75   Temp 98.6 F (37 C) (Oral)   Resp 16   SpO2 97%   Physical Exam Constitutional:      General: He is not in acute distress.    Appearance: Normal appearance. He is well-developed and normal weight. He is not ill-appearing or toxic-appearing.  HENT:     Head: Normocephalic and atraumatic.  Cardiovascular:     Rate and Rhythm: Normal rate and regular rhythm.     Heart sounds: Normal heart sounds.  Pulmonary:     Effort: Pulmonary effort is normal.     Breath sounds: Normal breath sounds.     Comments: Clear to auscultation bilaterally  Abdominal:     General: Bowel sounds are normal.     Palpations: Abdomen is soft.     Tenderness: There is no abdominal tenderness.  Skin:    General: Skin is warm and dry.  Neurological:     General: No focal deficit present.     Mental Status: He is alert.     GCS: GCS eye subscore is 4. GCS verbal subscore is 5. GCS motor subscore is 6.     Cranial Nerves: Cranial nerves 2-12 are intact.  Psychiatric:        Mood and Affect: Mood and affect normal.     Results:  Labs: No results found for this or any previous visit (from the past 24 hours).  Radiology: No results found.   UC  Course/Treatments:  Procedures: ED EKG  Date/Time: 01/02/2024 2:26 PM  Performed by: Avi Body, PA-C Authorized by: Avi Body, PA-C   Previous ECG:    Previous ECG:  Compared to current   Similarity:  Changes noted Interpretation:    Interpretation: non-specific   Rate:    ECG rate:  66   ECG rate assessment: normal   Rhythm:    Rhythm: sinus rhythm   QRS:    QRS axis:  Normal   QRS intervals:  Normal   QRS conduction: normal   ST segments:    ST segments:  Non-specific   Details:  AVL, V2 T waves:    T waves: inverted     Inverted:  V5, V6, II and III    Medications Ordered in UC: Medications  aspirin  chewable tablet 324 mg (has no administration in time range)    Assessment and Plan :     ICD-10-CM   1. Left-sided chest pain  R07.9     2. Shortness of breath  R06.02      Left-sided chest pain Shortness of breath Patient presented to urgent care complaining of sudden onset left sided chest pain/left shoulder pain and shortness of breath.   During triage, BP was noted to be slightly elevated at 147/87.  EKG showed a NSR with slight elevations at avL and V2 as well as T wave inversions in leads II, V5, V6. Elevations at aVL and inversions in II, V5, and V6 are new compared to the EKG from 06/2018.  Given sudden onset of symptoms and changes in EKG, it is recommend that patient go directly to the ER for further evaluation/treatment/observation. Concern for possible MI versus atypical angina.   Patient was given 324mg  of Aspirin  prior to discharge.   Patient is stable for discharge to the emergency department. Patient declined EMS transport. he will be going to [facility] ER via private car driven by his wife.  ED Discharge Orders     None        I have reviewed the PDMP during this encounter.     Misty Rago P, PA-C 01/02/24 1427

## 2024-01-02 NOTE — Discharge Instructions (Addendum)
 Given your evaluation today and risk factors, I recommend you go directly to the ER for further imaging and evaluation.  Please noted our office is limited in the tests and imaging we can perform at our facility. Your evaluation was indicates a higher level of care is needed at this time.

## 2024-08-07 ENCOUNTER — Ambulatory Visit
Admission: EM | Admit: 2024-08-07 | Discharge: 2024-08-07 | Disposition: A | Discharge status: Home / Self Care | Attending: Internal Medicine | Admitting: Internal Medicine

## 2024-08-07 ENCOUNTER — Other Ambulatory Visit: Payer: Self-pay

## 2024-08-07 DIAGNOSIS — M5442 Lumbago with sciatica, left side: Secondary | ICD-10-CM

## 2024-08-07 DIAGNOSIS — M549 Dorsalgia, unspecified: Secondary | ICD-10-CM

## 2024-08-07 MED ORDER — BACLOFEN 5 MG PO TABS: 5.0000 mg | ORAL_TABLET | Freq: Three times a day (TID) | ORAL | 0 refills | Status: AC | PRN

## 2024-08-07 MED ORDER — PREDNISONE 10 MG (21) PO TBPK: ORAL_TABLET | Freq: Every day | ORAL | 0 refills | Status: DC

## 2024-08-07 NOTE — ED Triage Notes (Signed)
 C/O upper back pain and shoulder blade pain that radiates to right arm. Patient states increased pain after having an adjustment. Patient taking tylenol  for pain with slight relief.

## 2024-08-07 NOTE — ED Provider Notes (Signed)
 " [facility]  Note:  This document was prepared using Dragon voice recognition software and may include unintentional dictation errors.  MRN: [id number] DOB: 1961/02/29 DATE: 08/07/2024   Subjective:  Chief Complaint:  Chief Complaint  Patient presents with   Back Pain     HPI: Paul Thompson is a 64 y.o. male presenting for upper back pain for one week. Patient states he usually sees a land in [place], but a week ago saw a different chiropractor closer to home. He states the chiropractor was working on his neck and whole back at that time. He states the next day he started with pain in his right upper back and into his right shoulder. He states pain is sharp and occurs with random movements. He also reports lower back pain as well with some radiation into his left leg. He states yesterday he was driving on a bumpy road and felt sharp pains in several areas of his upper and lower back. He reports taking tylenol  with little relief. Pain worse with flexion of neck. Denies fever, nausea/vomiting, abdominal pain, dysuria, hematuria. Endorses upper back pain, low back pain, right shoulder pain. Presents NAD.  Prior to Admission medications  Medication Sig Start Date End Date Taking? Authorizing Provider  amLODipine (NORVASC) 5 MG tablet Take by mouth. 06/20/18   [provider]  sildenafil (REVATIO) 20 MG tablet Take 1 to 2 tablet prior to sexual intercourse once a day 12/28/16   [provider]     Allergies[1]  History:   Past Medical History:  Diagnosis Date   Hypertension      Past Surgical History:  Procedure Laterality Date   NO PAST SURGERIES      Family History  Problem Relation Age of Onset   Diabetes Other     Social History[2]  Review of Systems  Constitutional:  Negative for fever.  Gastrointestinal:  Negative for abdominal pain, nausea and vomiting.  Genitourinary:  Negative for dysuria and hematuria.  Musculoskeletal:  Positive for  arthralgias, back pain and myalgias.  Neurological:  Negative for numbness.     Objective:   Vitals: BP (!) 136/90 (BP Location: Right Arm)   Pulse 66   Temp 98.1 F (36.7 C) (Oral)   Resp 18   SpO2 98%   Physical Exam Constitutional:      General: He is not in acute distress.    Appearance: Normal appearance. He is well-developed and normal weight. He is not ill-appearing or toxic-appearing.  HENT:     Head: Normocephalic and atraumatic.  Cardiovascular:     Rate and Rhythm: Normal rate and regular rhythm.     Heart sounds: Normal heart sounds.  Pulmonary:     Effort: Pulmonary effort is normal.     Breath sounds: Normal breath sounds.     Comments: Clear to auscultation bilaterally  Abdominal:     General: Bowel sounds are normal.     Palpations: Abdomen is soft.     Tenderness: There is no abdominal tenderness. There is no right CVA tenderness or left CVA tenderness.  Musculoskeletal:     Cervical back: Decreased range of motion.     Thoracic back: Tenderness present. Decreased range of motion.     Lumbar back: No tenderness. Normal range of motion.     Comments: Decreased ROM in upper back due to pain with flexion of neck. TTP of right trapezius.  Skin:    General: Skin is warm and dry.  Neurological:  General: No focal deficit present.     Mental Status: He is alert.  Psychiatric:        Mood and Affect: Mood and affect normal.     Results:  Labs: No results found for this or any previous visit (from the past 24 hours).  Radiology: No results found.   UC Course/Treatments:  Procedures: Procedures   Medications Ordered in UC: Medications - No data to display   Assessment and Plan :     ICD-10-CM   1. Acute upper back pain  M54.9     2. Acute bilateral low back pain with left-sided sciatica  M54.42      Acute upper back pain Acute bilateral low back pain with left-sided sciatica Afebrile, nontoxic-appearing, NAD. VSS. DDX includes but not  limited to: fracture, contusion, dislocation, strain  Unable to perform imaging due to lack of RT. Low suspicion of fracture given no known injury. Suspect strain. Baclofen  5mg  TID PRN was prescribed for muscle spasms and pain as well as Prednisone  10mg  every day as directed. Recommend follow up for imaging if no improvement. Patient agreeable to plan. Strict ED precautions were given and patient verbalized understanding.  ED Discharge Orders          Ordered    predniSONE  (STERAPRED UNI-PAK 21 TAB) 10 MG (21) TBPK tablet  Daily        08/07/24 0851    Baclofen  5 MG TABS  3 times daily PRN        08/07/24 0851             PDMP not reviewed this encounter.      [1]  Allergies Allergen Reactions   Lisinopril Other (See Comments) and Palpitations   Losartan Potassium Other (See Comments) and Anxiety  [2]  Social History Tobacco Use   Smoking status: Never   Smokeless tobacco: Never  Vaping Use   Vaping status: Never Used  Substance Use Topics   Alcohol  use: No    Alcohol /week: 0.0 standard drinks of alcohol    Drug use: No     Yeiden Frenkel P, PA-C 08/07/24 0858  "

## 2024-08-07 NOTE — Discharge Instructions (Signed)
 You have been prescribed Prednisone  for your back. This is a steroid often used to treat pain and inflammation. Please take as directed. You should avoid taking NSAIDs (ibuprofen, aleve, advil, ect.) with it due to risk of GI upset. It is okay to take tylenol  with it.   You were also given a prescription for a muscle relaxer (Baclofen ) take at bedtime when you are not driving or working because it may cause dizziness/drowsiness.  Recommend rest and ice.   Return in 3-4 days if no improvement. Your evaluation was not suggestive of any emergent condition requiring medical intervention at this time. However, our office is limited in the tests and imaging we can perform at our facility.  Therefore, it is very important for you to pay attention to any new symptoms or worsening of your current condition.   Please go directly to the Emergency Department immediately should you begin to feel worse in any way or have any of the following symptoms: bowel/urinary incontinence, numbness between your legs, fever, new onset numbness/tingling.

## 2024-08-13 ENCOUNTER — Other Ambulatory Visit: Payer: Self-pay

## 2024-08-13 ENCOUNTER — Ambulatory Visit: Payer: Self-pay | Admitting: Internal Medicine

## 2024-08-13 ENCOUNTER — Ambulatory Visit

## 2024-08-13 ENCOUNTER — Telehealth: Payer: Self-pay

## 2024-08-13 ENCOUNTER — Ambulatory Visit
Admission: EM | Admit: 2024-08-13 | Discharge: 2024-08-13 | Disposition: A | Discharge status: Home / Self Care | Attending: Internal Medicine | Admitting: Internal Medicine

## 2024-08-13 DIAGNOSIS — M5442 Lumbago with sciatica, left side: Secondary | ICD-10-CM | POA: Diagnosis not present

## 2024-08-13 DIAGNOSIS — M542 Cervicalgia: Secondary | ICD-10-CM | POA: Diagnosis not present

## 2024-08-13 MED ORDER — ACETAMINOPHEN 325 MG PO TABS
975.0000 mg | ORAL_TABLET | Freq: Once | ORAL | Status: AC
  Administered 2024-08-13: 975 mg via ORAL

## 2024-08-13 NOTE — Discharge Instructions (Signed)
 You were  given an order for an x-ray. Take this to [facility] for imaging. You can walk right in with your x-ray order and do not have to be seen. I have attached their information to your paperwork.   Recommend rest and ice. You can alternate Tylenol  and NSAIDs (Ibuprofen, Alleve) as directed for pain. You can continue with your muscle relaxer as well.   If no improvement by next week, you should follow up with orthopedics.   Your evaluation was not suggestive of any emergent condition requiring medical intervention at this time. However, our office is limited in the tests and imaging we can perform at our facility.  Therefore, it is very important for you to pay attention to any new symptoms or worsening of your current condition.   Please go directly to the Emergency Department immediately should you begin to feel worse in any way or have any of the following symptoms: bowel/urinary incontinence, numbness between your legs, fever, new onset numbness/tingling.

## 2024-08-13 NOTE — Telephone Encounter (Signed)
 Patient notified of imaging results by nursing staff.

## 2024-08-13 NOTE — ED Provider Notes (Signed)
 " [facility]  Note:  This document was prepared using Dragon voice recognition software and may include unintentional dictation errors.  MRN: [id number] DOB: 02-25-1961 DATE: 08/13/24   Subjective:  Chief Complaint:  Chief Complaint  Patient presents with   Back Pain     HPI: Paul Thompson is a 64 y.o. male presenting for low back pain with left sided sciatica for the past 2 weeks. Patient was seen on 08/07/2024 for a similar pain. At that time, he had been having neck pain and low back after a recent chiropractor adjustment. He stated at that time he had been to a different chiropractor closer to his home and was having pain after the adjustment. Reported neck pain with radiation into his upper back as well as low back pain with some left sided sciatica at that time. He was diagnosed with a strain and placed on Baclofen  and a Prednisone  taper. Unable perform imaging due to lack of RT at that time. He presents today stating he took the prescriptions as directed. He reports that he felt better and had improvement with both medications. He reports no pain, but still some tightness/soreness in his upper back with certain movements. He presents today because he was on his way to work an bent over to pick up his work bag when he felt aching in his lower back bilaterally. He reports some radiation into his left leg as well. He states he had some soreness in his upper back and left arm as well during that time. He states he did not take anything for the pain. Nothing makes it better or worse. He had some improvement with rest. Patient requesting x-ray. Denies fever, nausea/vomiting, dysuria, hematuria, urinary/bowel incontinence, saddle paresthesia. Endorses low back pain with left sided sciatica, neck pain. Presents NAD.  Prior to Admission medications  Medication Sig Start Date End Date Taking? Authorizing Provider  amLODipine (NORVASC) 5 MG tablet Take by mouth. 06/20/18   [provider]  Baclofen  5 MG TABS Take 1 tablet (5 mg total) by mouth 3 (three) times daily as needed. 08/07/24   Kejon Feild P, PA-C  sildenafil (REVATIO) 20 MG tablet Take 1 to 2 tablet prior to sexual intercourse once a day 12/28/16   [provider]     Allergies[1]  History:   Past Medical History:  Diagnosis Date   Hypertension      Past Surgical History:  Procedure Laterality Date   NO PAST SURGERIES      Family History  Problem Relation Age of Onset   Diabetes Other     Social History[2]  Review of Systems  Constitutional:  Negative for fever.  Gastrointestinal:  Negative for nausea and vomiting.  Genitourinary:  Negative for dysuria, flank pain and hematuria.  Musculoskeletal:  Positive for back pain, myalgias and neck pain.  Neurological:  Negative for numbness.     Objective:   Vitals: BP (!) 155/94 (BP Location: Right Arm)   Pulse 63   Temp 97.9 F (36.6 C) (Oral)   Resp 18   SpO2 98%   Physical Exam Constitutional:      General: He is not in acute distress.    Appearance: Normal appearance. He is well-developed and normal weight. He is not ill-appearing or toxic-appearing.  HENT:     Head: Normocephalic and atraumatic.  Cardiovascular:     Rate and Rhythm: Normal rate and regular rhythm.     Heart sounds: Normal heart sounds.  Pulmonary:  Effort: Pulmonary effort is normal.     Breath sounds: Normal breath sounds.     Comments: Clear to auscultation bilaterally  Abdominal:     General: Bowel sounds are normal.     Palpations: Abdomen is soft.     Tenderness: There is no abdominal tenderness. There is no right CVA tenderness or left CVA tenderness.  Musculoskeletal:     Lumbar back: No tenderness. Decreased range of motion.     Comments: Nontender to palpation. Decreased ROM due to pain with flexion of lower back. NV intact.   Skin:    General: Skin is warm and dry.  Neurological:     General: No focal deficit present.     Mental  Status: He is alert.  Psychiatric:        Mood and Affect: Mood and affect normal.     Results:  Labs: No results found for this or any previous visit (from the past 24 hours).  Radiology: No results found.   UC Course/Treatments:  Procedures: Procedures   Medications Ordered in UC: Medications  acetaminophen  (TYLENOL ) tablet 975 mg (has no administration in time range)     Assessment and Plan :     ICD-10-CM   1. Acute bilateral low back pain with left-sided sciatica  M54.42     2. Neck pain  M54.2       Acute bilateral low back pain with left-sided sciatica Afebrile, nontoxic-appearing, NAD. VSS. DDX includes but not limited to: fracture, contusion, dislocation, strain Low suspicion for fracture. Reports history of low back pain years ago. Suspect strain from recent adjustment. Unable to perform imaging at this time due to lack of RT. Order was provided for outpatient imaging at [facility]. Tylenol  975mg  PO was given in office for pain. Continue with baclofen  at home. Follow up with orthopedics if no improvement. Strict ED precautions were given and patient verbalized understanding.  Neck pain Afebrile, nontoxic-appearing, NAD. VSS. DDX includes but not limited to: fracture, contusion, dislocation, strain Appears to be improving. Suspect strain from recent adjustment. Unable to perform imaging at this time due to lack of RT. Order was provided for outpatient imaging at [facility]. Tylenol  975mg  PO was given in office for pain. Continue with baclofen  at home. Follow up with orthopedics if no improvement. Strict ED precautions were given and patient verbalized understanding.  ED Discharge Orders          Ordered    DG Lumbar Spine 2-3 Views        08/13/24 0834    DG Cervical Spine 2 or 3 views        08/13/24 0834             PDMP not reviewed this encounter.      [1]  Allergies Allergen Reactions   Lisinopril Other (See  Comments) and Palpitations   Losartan Potassium Other (See Comments) and Anxiety  [2]  Social History Tobacco Use   Smoking status: Never   Smokeless tobacco: Never  Vaping Use   Vaping status: Never Used  Substance Use Topics   Alcohol  use: No    Alcohol /week: 0.0 standard drinks of alcohol    Drug use: No     Basilia Ulanda SQUIBB, PA-C 08/13/24 0852  "

## 2024-08-13 NOTE — ED Triage Notes (Signed)
"   C/O lower left back pain radiating down left leg after reaching down to grab a bog. Patient states he has previous back problems.  Patient denies numbness or tingling to extremities.  "

## 2024-08-13 NOTE — Telephone Encounter (Signed)
 Patient called and results of X-Ray given. Patient verbalized understanding. Advised no change in the treatment plan. Follow up as needed.
